# Patient Record
Sex: Female | Born: 1960 | Race: White | Hispanic: No | Marital: Married | Smoking: Never smoker
Health system: Southern US, Community
[De-identification: ages and names within clinical notes are randomized; demographics above are authoritative.]

## PROBLEM LIST (undated history)

## (undated) DIAGNOSIS — G4700 Insomnia, unspecified: Secondary | ICD-10-CM

## (undated) DIAGNOSIS — M810 Age-related osteoporosis without current pathological fracture: Secondary | ICD-10-CM

## (undated) DIAGNOSIS — E039 Hypothyroidism, unspecified: Secondary | ICD-10-CM

## (undated) DIAGNOSIS — G43909 Migraine, unspecified, not intractable, without status migrainosus: Secondary | ICD-10-CM

## (undated) HISTORY — PX: ULNAR NERVE REPAIR: SHX2594

## (undated) HISTORY — PX: FACIAL FRACTURE SURGERY: SHX1570

## (undated) HISTORY — DX: Hypothyroidism, unspecified: E03.9

## (undated) HISTORY — DX: Migraine, unspecified, not intractable, without status migrainosus: G43.909

## (undated) HISTORY — PX: SHOULDER ARTHROSCOPY W/ ROTATOR CUFF REPAIR: SHX2400

## (undated) HISTORY — PX: TONSILLECTOMY: SUR1361

## (undated) HISTORY — DX: Age-related osteoporosis without current pathological fracture: M81.0

## (undated) HISTORY — PX: ABDOMINAL HYSTERECTOMY: SHX81

## (undated) HISTORY — DX: Insomnia, unspecified: G47.00

---

## 1988-05-14 HISTORY — PX: BREAST REDUCTION SURGERY: SHX8

## 1998-05-02 ENCOUNTER — Ambulatory Visit (HOSPITAL_BASED_OUTPATIENT_CLINIC_OR_DEPARTMENT_OTHER): Admission: RE | Admit: 1998-05-02 | Discharge: 1998-05-02 | Payer: Self-pay | Admitting: Specialist

## 2001-01-15 ENCOUNTER — Other Ambulatory Visit: Admission: RE | Admit: 2001-01-15 | Discharge: 2001-01-15 | Payer: Self-pay | Admitting: Obstetrics and Gynecology

## 2001-01-28 ENCOUNTER — Ambulatory Visit (HOSPITAL_COMMUNITY): Admission: RE | Admit: 2001-01-28 | Discharge: 2001-01-28 | Payer: Self-pay | Admitting: Gastroenterology

## 2001-01-28 ENCOUNTER — Encounter: Payer: Self-pay | Admitting: Gastroenterology

## 2001-02-20 ENCOUNTER — Encounter (INDEPENDENT_AMBULATORY_CARE_PROVIDER_SITE_OTHER): Payer: Self-pay | Admitting: Specialist

## 2001-02-20 ENCOUNTER — Other Ambulatory Visit: Admission: RE | Admit: 2001-02-20 | Discharge: 2001-02-20 | Payer: Self-pay | Admitting: Gastroenterology

## 2001-05-22 ENCOUNTER — Emergency Department (HOSPITAL_COMMUNITY): Admission: EM | Admit: 2001-05-22 | Discharge: 2001-05-22 | Payer: Self-pay | Admitting: Emergency Medicine

## 2004-02-08 ENCOUNTER — Emergency Department (HOSPITAL_COMMUNITY): Admission: EM | Admit: 2004-02-08 | Discharge: 2004-02-08 | Payer: Self-pay | Admitting: Emergency Medicine

## 2004-02-14 ENCOUNTER — Emergency Department (HOSPITAL_COMMUNITY): Admission: EM | Admit: 2004-02-14 | Discharge: 2004-02-15 | Payer: Self-pay | Admitting: Emergency Medicine

## 2007-11-22 ENCOUNTER — Emergency Department: Payer: Self-pay | Admitting: Emergency Medicine

## 2007-12-03 ENCOUNTER — Ambulatory Visit: Payer: Self-pay | Admitting: Orthopaedic Surgery

## 2008-01-12 ENCOUNTER — Ambulatory Visit: Payer: Self-pay | Admitting: Gastroenterology

## 2008-01-12 DIAGNOSIS — R630 Anorexia: Secondary | ICD-10-CM

## 2008-01-12 DIAGNOSIS — R634 Abnormal weight loss: Secondary | ICD-10-CM

## 2008-01-12 DIAGNOSIS — E039 Hypothyroidism, unspecified: Secondary | ICD-10-CM | POA: Insufficient documentation

## 2008-01-12 DIAGNOSIS — R197 Diarrhea, unspecified: Secondary | ICD-10-CM

## 2008-01-12 LAB — CONVERTED CEMR LAB
ALT: 15 units/L (ref 0–35)
IgA: 328 mg/dL (ref 68–378)

## 2008-01-13 ENCOUNTER — Encounter: Payer: Self-pay | Admitting: Gastroenterology

## 2008-01-15 ENCOUNTER — Encounter (INDEPENDENT_AMBULATORY_CARE_PROVIDER_SITE_OTHER): Payer: Self-pay

## 2008-02-07 ENCOUNTER — Ambulatory Visit: Payer: Self-pay | Admitting: Orthopaedic Surgery

## 2008-12-22 ENCOUNTER — Other Ambulatory Visit: Admission: RE | Admit: 2008-12-22 | Discharge: 2008-12-22 | Payer: Self-pay | Admitting: Obstetrics and Gynecology

## 2008-12-30 ENCOUNTER — Ambulatory Visit (HOSPITAL_COMMUNITY): Admission: RE | Admit: 2008-12-30 | Discharge: 2008-12-30 | Payer: Self-pay | Admitting: Obstetrics and Gynecology

## 2009-01-24 ENCOUNTER — Emergency Department (HOSPITAL_COMMUNITY): Admission: EM | Admit: 2009-01-24 | Discharge: 2009-01-24 | Payer: Self-pay | Admitting: Emergency Medicine

## 2010-03-23 ENCOUNTER — Encounter: Admission: RE | Admit: 2010-03-23 | Discharge: 2010-03-23 | Payer: Self-pay | Admitting: Internal Medicine

## 2010-08-09 ENCOUNTER — Ambulatory Visit
Admission: RE | Admit: 2010-08-09 | Discharge: 2010-08-09 | Disposition: A | Discharge status: Home / Self Care | Payer: BC Managed Care – PPO | Source: Ambulatory Visit | Attending: Internal Medicine | Admitting: Internal Medicine

## 2010-08-09 ENCOUNTER — Other Ambulatory Visit: Payer: Self-pay | Admitting: Internal Medicine

## 2010-08-09 DIAGNOSIS — R05 Cough: Secondary | ICD-10-CM

## 2011-01-26 ENCOUNTER — Other Ambulatory Visit: Payer: Self-pay | Admitting: Internal Medicine

## 2011-01-26 DIAGNOSIS — R29890 Loss of height: Secondary | ICD-10-CM

## 2011-01-29 ENCOUNTER — Ambulatory Visit
Admission: RE | Admit: 2011-01-29 | Discharge: 2011-01-29 | Disposition: A | Discharge status: Home / Self Care | Payer: BC Managed Care – PPO | Source: Ambulatory Visit | Attending: Internal Medicine | Admitting: Internal Medicine

## 2011-01-29 ENCOUNTER — Other Ambulatory Visit: Payer: Self-pay | Admitting: Internal Medicine

## 2011-01-29 DIAGNOSIS — Z1239 Encounter for other screening for malignant neoplasm of breast: Secondary | ICD-10-CM

## 2011-01-29 DIAGNOSIS — R29890 Loss of height: Secondary | ICD-10-CM

## 2011-09-14 ENCOUNTER — Ambulatory Visit
Admission: RE | Admit: 2011-09-14 | Discharge: 2011-09-14 | Disposition: A | Discharge status: Home / Self Care | Payer: 59 | Source: Ambulatory Visit | Attending: Internal Medicine | Admitting: Internal Medicine

## 2011-09-14 ENCOUNTER — Other Ambulatory Visit: Payer: Self-pay | Admitting: Internal Medicine

## 2011-09-14 DIAGNOSIS — S99921A Unspecified injury of right foot, initial encounter: Secondary | ICD-10-CM

## 2011-09-14 DIAGNOSIS — R52 Pain, unspecified: Secondary | ICD-10-CM

## 2012-01-17 ENCOUNTER — Other Ambulatory Visit: Payer: Self-pay | Admitting: Internal Medicine

## 2012-01-19 ENCOUNTER — Ambulatory Visit
Admission: RE | Admit: 2012-01-19 | Discharge: 2012-01-19 | Disposition: A | Discharge status: Home / Self Care | Payer: 59 | Source: Ambulatory Visit | Attending: Internal Medicine | Admitting: Internal Medicine

## 2012-12-20 ENCOUNTER — Encounter: Payer: Self-pay | Admitting: Gastroenterology

## 2013-07-31 ENCOUNTER — Encounter: Payer: Self-pay | Admitting: Gastroenterology

## 2015-11-11 ENCOUNTER — Ambulatory Visit (INDEPENDENT_AMBULATORY_CARE_PROVIDER_SITE_OTHER): Payer: 59 | Admitting: Pediatrics

## 2015-11-11 ENCOUNTER — Encounter: Payer: Self-pay | Admitting: Pediatrics

## 2015-11-11 VITALS — BP 100/70 | HR 92 | Temp 98.1°F | Resp 16 | Ht 62.4 in | Wt 126.3 lb

## 2015-11-11 DIAGNOSIS — K219 Gastro-esophageal reflux disease without esophagitis: Secondary | ICD-10-CM | POA: Insufficient documentation

## 2015-11-11 DIAGNOSIS — L500 Allergic urticaria: Secondary | ICD-10-CM

## 2015-11-11 DIAGNOSIS — J301 Allergic rhinitis due to pollen: Secondary | ICD-10-CM | POA: Diagnosis not present

## 2015-11-11 MED ORDER — RANITIDINE HCL 150 MG PO TABS: 150.0000 mg | ORAL_TABLET | Freq: Two times a day (BID) | ORAL | Status: AC

## 2015-11-11 NOTE — Patient Instructions (Addendum)
Claritin 10 mg once a day if needed for runny nose or itching See if  bananas irritate your stomach I gave you instructions for gastroesophageal reflux and lactose intolerance Ranitidine 150 mg twice a day for stomach acid if needed Add milk into the diet with lactaid to see if your  symptoms get worse Put 2 small amounts of Carmex and the other lip balm in separate arms and see if you get some redness or itching from them over the next 3 days If you have swelling of your  lips again, write what you had to eat or drink in the previous 4 hours. You may take Benadryl 25 mg every 4 hours if needed  Cool compresses to your lips for 5 minutes. Pat dry and then use Eucerin cream

## 2015-11-11 NOTE — Progress Notes (Signed)
[address] [place] [address] Dept: [phone]  New Patient Note  Patient ID: Kristen DonningJeanne D Mellinger, female    DOB: 05/05/1961  Age: 55 y.o. MRN: [id number] Date of Office Visit: 11/11/2015 Referring provider: No referring provider defined for this encounter.    Chief Complaint: Angioedema  HPI Kristen Dougherty presents for evaluation of 2 episodes of lip swelling associated  with some burning and itching of her lips. Her  symptoms resolved over a week. She has a history of seasonal allergic rhinitis for several years. She is concerned that soy milk or Lactaid milk caused some of the swelling. She has a lactose intolerance. She has a history of osteoporosis since age 55. She has never had hives, eczema or asthmatic symptoms. She is concerned that she may have an allergy to Carmex and and other lip balm. Her lips have been very dry since the reaction occurred.  Review of Systems  Constitutional: Negative.   HENT:       Sinus problems and allergic symptoms particularly in the springtime for several years  Eyes:       One small cataract  Respiratory: Negative.   Cardiovascular: Negative.   Gastrointestinal:       Heartburn at times  Genitourinary: Negative.   Musculoskeletal:       Osteoporosis since age 55. Surgery for rotator cuff and ulnar nerve  compression  Skin:       Lip swelling on 2 different locations lasting about a week  Endo/Heme/Allergies:       No diabetes. Low thyroid function. Vitamin B12 deficiency at times    Outpatient Encounter Prescriptions as of 11/11/2015  Medication Sig  . clonazePAM (KLONOPIN) 1 MG tablet   . cyanocobalamin (,VITAMIN B-12,) 1000 MCG/ML injection INJECT 1 MILLILITER EVERY MONTH  . ibuprofen (ADVIL,MOTRIN) 800 MG tablet   . levothyroxine (SYNTHROID, LEVOTHROID) 100 MCG tablet Take by mouth.  . sodium fluoride (SF) 1.1 % GEL dental gel   . zolpidem (AMBIEN CR) 12.5 MG CR tablet Take by mouth.  . [DISCONTINUED] clonazePAM (KLONOPIN) 1  MG tablet   . ranitidine (ZANTAC) 150 MG tablet Take 1 tablet (150 mg total) by mouth 2 (two) times daily.   No facility-administered encounter medications on file as of 11/11/2015.     Drug Allergies:  Allergies  Allergen Reactions  . Morphine Nausea And Vomiting  . Azithromycin     REACTION: stomach upset    Family History: Lamees's family history is negative for Allergic rhinitis, Angioedema, Asthma, Eczema, Immunodeficiency, and Urticaria..  Social and environmental. She is a Surveyor, miningphysician's assistant. There are 3 dogs in the home. She is not exposed to cigarette smoking. She has never smoked cigarettes    Physical Exam: BP 100/70 mmHg  Pulse 92  Temp(Src) 98.1 F (36.7 C) (Oral)  Resp 16  Ht 5' 2.4" (1.585 m)  Wt 126 lb 5.2 oz (57.3 kg)  BMI 22.81 kg/m2   Physical Exam  Constitutional: She is oriented to person, place, and time. She appears well-developed and well-nourished.  HENT:  Eyes normal. Ears normal. Nose normal. Pharynx normal.  Neck: Neck supple. No thyromegaly present.  Cardiovascular:  S1 and S2 normal no murmurs  Pulmonary/Chest:  Clear to percussion and auscultation  Abdominal: Soft. There is tenderness (nonspecific tenderness in the mid epigastrium. No hepatosplenomegaly).  Lymphadenopathy:    She has no cervical adenopathy.  Neurological: She is alert and oriented to person, place, and time.  Skin:  Clear  Psychiatric: She  has a normal mood and affect. Her behavior is normal. Judgment and thought content normal.  Vitals reviewed.   Diagnostics: Allergy skin test showed a slight reactivity to banana and ragweed. Skin testing to other foods including soy and milk was negative   Assessment Assessment and Plan: 1. Allergic urticaria   2. Allergic rhinitis due to pollen     Meds ordered this encounter  Medications  . ranitidine (ZANTAC) 150 MG tablet    Sig: Take 1 tablet (150 mg total) by mouth 2 (two) times daily.    Dispense:  60 tablet     Refill:  5    For stomach acid if needed.    Patient Instructions  Claritin 10 mg once a day if needed for runny nose or itching See if  bananas irritate your stomach I gave you instructions for gastroesophageal reflux and lactose intolerance Ranitidine 150 mg twice a day for stomach acid if needed Add milk into the diet with lactaid to see if your  symptoms get worse Put 2 small amounts of Carmex and the other lip balm in separate arms and see if you get some redness or itching from them over the next 3 days If you have swelling of your  lips again, write what you had to eat or drink in the previous 4 hours. You may take Benadryl 25 mg every 4 hours if needed  Cool compresses to your lips for 5 minutes. Pat dry and then use Eucerin cream    Return if symptoms worsen or fail to improve.   Thank you for the opportunity to care for this patient.  Please do not hesitate to contact me with questions.  Tonette BihariJ. A. Addis Bennie, M.D.  [facility] [address] [place], [place] [address] [phone]

## 2015-11-22 ENCOUNTER — Ambulatory Visit: Payer: Self-pay | Admitting: Allergy and Immunology

## 2016-05-03 ENCOUNTER — Other Ambulatory Visit: Payer: Self-pay | Admitting: Internal Medicine

## 2016-05-03 DIAGNOSIS — R1010 Upper abdominal pain, unspecified: Secondary | ICD-10-CM

## 2016-05-10 ENCOUNTER — Other Ambulatory Visit: Payer: Self-pay

## 2016-05-11 ENCOUNTER — Other Ambulatory Visit: Payer: Self-pay

## 2016-05-25 ENCOUNTER — Other Ambulatory Visit: Payer: Self-pay

## 2016-05-28 ENCOUNTER — Ambulatory Visit
Admission: RE | Admit: 2016-05-28 | Discharge: 2016-05-28 | Disposition: A | Discharge status: Home / Self Care | Payer: 59 | Source: Ambulatory Visit | Attending: Internal Medicine | Admitting: Internal Medicine

## 2016-05-28 DIAGNOSIS — R1010 Upper abdominal pain, unspecified: Secondary | ICD-10-CM

## 2016-08-08 ENCOUNTER — Encounter (HOSPITAL_BASED_OUTPATIENT_CLINIC_OR_DEPARTMENT_OTHER): Payer: Self-pay | Admitting: *Deleted

## 2016-08-08 ENCOUNTER — Emergency Department (HOSPITAL_BASED_OUTPATIENT_CLINIC_OR_DEPARTMENT_OTHER): Payer: 59

## 2016-08-08 ENCOUNTER — Inpatient Hospital Stay (HOSPITAL_BASED_OUTPATIENT_CLINIC_OR_DEPARTMENT_OTHER)
Admission: EM | Admit: 2016-08-08 | Discharge: 2016-08-13 | DRG: 552 | Disposition: A | Discharge status: Home / Self Care | Payer: 59 | Attending: Family Medicine | Admitting: Family Medicine

## 2016-08-08 DIAGNOSIS — M810 Age-related osteoporosis without current pathological fracture: Secondary | ICD-10-CM | POA: Diagnosis present

## 2016-08-08 DIAGNOSIS — L270 Generalized skin eruption due to drugs and medicaments taken internally: Secondary | ICD-10-CM | POA: Diagnosis not present

## 2016-08-08 DIAGNOSIS — R21 Rash and other nonspecific skin eruption: Secondary | ICD-10-CM | POA: Diagnosis not present

## 2016-08-08 DIAGNOSIS — R591 Generalized enlarged lymph nodes: Secondary | ICD-10-CM | POA: Diagnosis present

## 2016-08-08 DIAGNOSIS — R509 Fever, unspecified: Secondary | ICD-10-CM | POA: Diagnosis not present

## 2016-08-08 DIAGNOSIS — R05 Cough: Secondary | ICD-10-CM | POA: Diagnosis not present

## 2016-08-08 DIAGNOSIS — G629 Polyneuropathy, unspecified: Secondary | ICD-10-CM | POA: Diagnosis present

## 2016-08-08 DIAGNOSIS — L0291 Cutaneous abscess, unspecified: Secondary | ICD-10-CM | POA: Diagnosis present

## 2016-08-08 DIAGNOSIS — R112 Nausea with vomiting, unspecified: Secondary | ICD-10-CM | POA: Diagnosis present

## 2016-08-08 DIAGNOSIS — M25512 Pain in left shoulder: Secondary | ICD-10-CM | POA: Diagnosis not present

## 2016-08-08 DIAGNOSIS — T368X5A Adverse effect of other systemic antibiotics, initial encounter: Secondary | ICD-10-CM | POA: Diagnosis not present

## 2016-08-08 DIAGNOSIS — Z881 Allergy status to other antibiotic agents status: Secondary | ICD-10-CM | POA: Diagnosis not present

## 2016-08-08 DIAGNOSIS — Y92238 Other place in hospital as the place of occurrence of the external cause: Secondary | ICD-10-CM | POA: Diagnosis not present

## 2016-08-08 DIAGNOSIS — R059 Cough, unspecified: Secondary | ICD-10-CM

## 2016-08-08 DIAGNOSIS — Z8261 Family history of arthritis: Secondary | ICD-10-CM | POA: Diagnosis not present

## 2016-08-08 DIAGNOSIS — G039 Meningitis, unspecified: Secondary | ICD-10-CM

## 2016-08-08 DIAGNOSIS — Z79899 Other long term (current) drug therapy: Secondary | ICD-10-CM | POA: Diagnosis not present

## 2016-08-08 DIAGNOSIS — E039 Hypothyroidism, unspecified: Secondary | ICD-10-CM | POA: Diagnosis present

## 2016-08-08 DIAGNOSIS — M542 Cervicalgia: Secondary | ICD-10-CM | POA: Diagnosis present

## 2016-08-08 DIAGNOSIS — K219 Gastro-esophageal reflux disease without esophagitis: Secondary | ICD-10-CM | POA: Diagnosis present

## 2016-08-08 DIAGNOSIS — R519 Headache, unspecified: Secondary | ICD-10-CM

## 2016-08-08 DIAGNOSIS — M436 Torticollis: Secondary | ICD-10-CM

## 2016-08-08 DIAGNOSIS — K449 Diaphragmatic hernia without obstruction or gangrene: Secondary | ICD-10-CM | POA: Diagnosis not present

## 2016-08-08 DIAGNOSIS — R51 Headache: Secondary | ICD-10-CM | POA: Diagnosis present

## 2016-08-08 DIAGNOSIS — K222 Esophageal obstruction: Secondary | ICD-10-CM | POA: Diagnosis not present

## 2016-08-08 DIAGNOSIS — Z885 Allergy status to narcotic agent status: Secondary | ICD-10-CM

## 2016-08-08 LAB — URINALYSIS, ROUTINE W REFLEX MICROSCOPIC
Bilirubin Urine: NEGATIVE
GLUCOSE, UA: NEGATIVE mg/dL
HGB URINE DIPSTICK: NEGATIVE
Ketones, ur: NEGATIVE mg/dL
LEUKOCYTES UA: NEGATIVE
Nitrite: NEGATIVE
PH: 6 (ref 5.0–8.0)
Protein, ur: NEGATIVE mg/dL
SPECIFIC GRAVITY, URINE: 1.009 (ref 1.005–1.030)

## 2016-08-08 LAB — CBC WITH DIFFERENTIAL/PLATELET
BASOS ABS: 0 10*3/uL (ref 0.0–0.1)
BASOS ABS: 0 10*3/uL (ref 0.0–0.1)
BASOS PCT: 1 %
Basophils Relative: 0 %
EOS PCT: 4 %
Eosinophils Absolute: 0.2 10*3/uL (ref 0.0–0.7)
Eosinophils Absolute: 0.2 10*3/uL (ref 0.0–0.7)
Eosinophils Relative: 3 %
HEMATOCRIT: 37.2 % (ref 36.0–46.0)
HEMATOCRIT: 33.9 % — AB (ref 36.0–46.0)
HEMOGLOBIN: 11.7 g/dL — AB (ref 12.0–15.0)
Hemoglobin: 13 g/dL (ref 12.0–15.0)
LYMPHS ABS: 1.1 10*3/uL (ref 0.7–4.0)
LYMPHS PCT: 18 %
Lymphocytes Relative: 17 %
Lymphs Abs: 1.2 10*3/uL (ref 0.7–4.0)
MCH: 30.3 pg (ref 26.0–34.0)
MCH: 30.2 pg (ref 26.0–34.0)
MCHC: 34.9 g/dL (ref 30.0–36.0)
MCHC: 34.5 g/dL (ref 30.0–36.0)
MCV: 86.7 fL (ref 78.0–100.0)
MCV: 87.4 fL (ref 78.0–100.0)
MONO ABS: 0.5 10*3/uL (ref 0.1–1.0)
MONOS PCT: 8 %
Monocytes Absolute: 0.6 10*3/uL (ref 0.1–1.0)
Monocytes Relative: 9 %
NEUTROS ABS: 4.3 10*3/uL (ref 1.7–7.7)
NEUTROS PCT: 71 %
NEUTROS PCT: 69 %
Neutro Abs: 4.4 10*3/uL (ref 1.7–7.7)
PLATELETS: 186 10*3/uL (ref 150–400)
Platelets: 164 10*3/uL (ref 150–400)
RBC: 4.29 MIL/uL (ref 3.87–5.11)
RBC: 3.88 MIL/uL (ref 3.87–5.11)
RDW: 12.3 % (ref 11.5–15.5)
RDW: 12.6 % (ref 11.5–15.5)
WBC: 6.2 10*3/uL (ref 4.0–10.5)
WBC: 6.3 10*3/uL (ref 4.0–10.5)

## 2016-08-08 LAB — COMPREHENSIVE METABOLIC PANEL
ALBUMIN: 4.1 g/dL (ref 3.5–5.0)
ALBUMIN: 3.7 g/dL (ref 3.5–5.0)
ALK PHOS: 36 U/L — AB (ref 38–126)
ALT: 21 U/L (ref 14–54)
ALT: 19 U/L (ref 14–54)
ANION GAP: 7 (ref 5–15)
AST: 20 U/L (ref 15–41)
AST: 16 U/L (ref 15–41)
Alkaline Phosphatase: 45 U/L (ref 38–126)
Anion gap: 5 (ref 5–15)
BUN: 12 mg/dL (ref 6–20)
BUN: 9 mg/dL (ref 6–20)
CALCIUM: 8.4 mg/dL — AB (ref 8.9–10.3)
CHLORIDE: 109 mmol/L (ref 101–111)
CHLORIDE: 107 mmol/L (ref 101–111)
CO2: 26 mmol/L (ref 22–32)
CO2: 25 mmol/L (ref 22–32)
CREATININE: 0.88 mg/dL (ref 0.44–1.00)
Calcium: 9.2 mg/dL (ref 8.9–10.3)
Creatinine, Ser: 0.79 mg/dL (ref 0.44–1.00)
GFR calc Af Amer: >60 mL/min (ref >60)
GFR calc non Af Amer: >60 mL/min (ref >60)
GFR calc non Af Amer: >60 mL/min (ref >60)
GLUCOSE: 114 mg/dL — AB (ref 65–99)
GLUCOSE: 75 mg/dL (ref 65–99)
POTASSIUM: 3.8 mmol/L (ref 3.5–5.1)
POTASSIUM: 3.4 mmol/L — AB (ref 3.5–5.1)
SODIUM: 139 mmol/L (ref 135–145)
Sodium: 140 mmol/L (ref 135–145)
Total Bilirubin: 0.8 mg/dL (ref 0.3–1.2)
Total Bilirubin: 0.6 mg/dL (ref 0.3–1.2)
Total Protein: 7.6 g/dL (ref 6.5–8.1)
Total Protein: 6.7 g/dL (ref 6.5–8.1)

## 2016-08-08 LAB — PROTIME-INR
INR: 0.98
Prothrombin Time: 13 seconds (ref 11.4–15.2)

## 2016-08-08 LAB — I-STAT CG4 LACTIC ACID, ED: LACTIC ACID, VENOUS: 1.21 mmol/L (ref 0.5–1.9)

## 2016-08-08 LAB — C-REACTIVE PROTEIN

## 2016-08-08 LAB — APTT: APTT: 33 s (ref 24–36)

## 2016-08-08 LAB — SEDIMENTATION RATE: SED RATE: 14 mm/h (ref 0–22)

## 2016-08-08 LAB — MONONUCLEOSIS SCREEN: Mono Screen: NEGATIVE

## 2016-08-08 MED ORDER — DIPHENHYDRAMINE HCL 50 MG/ML IJ SOLN
25.0000 mg | Freq: Four times a day (QID) | INTRAMUSCULAR | Status: DC | PRN
  Administered 2016-08-08 – 2016-08-12 (×9): 25 mg via INTRAVENOUS
  Filled 2016-08-08 (×9): qty 1

## 2016-08-08 MED ORDER — LEVOTHYROXINE SODIUM 25 MCG PO TABS
125.0000 ug | ORAL_TABLET | Freq: Every day | ORAL | Status: DC
  Administered 2016-08-09 – 2016-08-13 (×5): 125 ug via ORAL
  Filled 2016-08-08 (×5): qty 1

## 2016-08-08 MED ORDER — DEXTROSE 5 % IV SOLN
550.0000 mg | Freq: Three times a day (TID) | INTRAVENOUS | Status: DC
  Administered 2016-08-08 – 2016-08-12 (×12): 550 mg via INTRAVENOUS
  Filled 2016-08-08 (×13): qty 11

## 2016-08-08 MED ORDER — FAMOTIDINE 20 MG PO TABS: 20.0000 mg | ORAL_TABLET | Freq: Every day | ORAL | Status: DC

## 2016-08-08 MED ORDER — CLONAZEPAM 1 MG PO TABS
1.0000 mg | ORAL_TABLET | Freq: Three times a day (TID) | ORAL | Status: DC | PRN
  Administered 2016-08-09 – 2016-08-12 (×3): 1 mg via ORAL
  Filled 2016-08-08 (×5): qty 1

## 2016-08-08 MED ORDER — DIPHENHYDRAMINE HCL 50 MG/ML IJ SOLN
25.0000 mg | Freq: Once | INTRAMUSCULAR | Status: AC
  Administered 2016-08-08: 25 mg via INTRAVENOUS

## 2016-08-08 MED ORDER — ONDANSETRON HCL 4 MG/2ML IJ SOLN
4.0000 mg | Freq: Once | INTRAMUSCULAR | Status: AC
  Administered 2016-08-08: 4 mg via INTRAVENOUS

## 2016-08-08 MED ORDER — ONDANSETRON HCL 4 MG/2ML IJ SOLN
INTRAMUSCULAR | Status: AC
  Filled 2016-08-08: qty 2

## 2016-08-08 MED ORDER — DEXTROSE 5 % IV SOLN
2.0000 g | Freq: Once | INTRAVENOUS | Status: AC
  Administered 2016-08-08: 2 g via INTRAVENOUS
  Filled 2016-08-08: qty 2

## 2016-08-08 MED ORDER — VANCOMYCIN HCL IN DEXTROSE 750-5 MG/150ML-% IV SOLN
750.0000 mg | Freq: Two times a day (BID) | INTRAVENOUS | Status: DC
  Administered 2016-08-09 – 2016-08-10 (×4): 750 mg via INTRAVENOUS
  Filled 2016-08-08 (×4): qty 150

## 2016-08-08 MED ORDER — SODIUM CHLORIDE 0.9 % IV SOLN
2.0000 g | INTRAVENOUS | Status: DC
  Administered 2016-08-08 – 2016-08-10 (×11): 2 g via INTRAVENOUS
  Filled 2016-08-08 (×14): qty 2000

## 2016-08-08 MED ORDER — SODIUM CHLORIDE 0.9% FLUSH
3.0000 mL | INTRAVENOUS | Status: DC | PRN
  Administered 2016-08-09: 3 mL via INTRAVENOUS
  Filled 2016-08-08: qty 3

## 2016-08-08 MED ORDER — HYDROMORPHONE HCL 1 MG/ML IJ SOLN
1.0000 mg | Freq: Once | INTRAMUSCULAR | Status: AC
  Administered 2016-08-08: 1 mg via INTRAVENOUS
  Filled 2016-08-08: qty 1

## 2016-08-08 MED ORDER — SODIUM CHLORIDE 0.9 % IV SOLN
Freq: Once | INTRAVENOUS | Status: AC
Start: 2016-08-08 — End: 2016-08-08
  Administered 2016-08-08: 1000 mL via INTRAVENOUS

## 2016-08-08 MED ORDER — LEVOTHYROXINE SODIUM 100 MCG PO TABS: 100.0000 ug | ORAL_TABLET | Freq: Every day | ORAL | Status: DC

## 2016-08-08 MED ORDER — HYDROMORPHONE HCL 1 MG/ML IJ SOLN
0.5000 mg | Freq: Once | INTRAMUSCULAR | Status: AC
Start: 2016-08-08 — End: 2016-08-08
  Administered 2016-08-08: 0.5 mg via INTRAVENOUS

## 2016-08-08 MED ORDER — VANCOMYCIN HCL IN DEXTROSE 1-5 GM/200ML-% IV SOLN
1000.0000 mg | Freq: Once | INTRAVENOUS | Status: AC
  Administered 2016-08-08: 1000 mg via INTRAVENOUS
  Filled 2016-08-08: qty 200

## 2016-08-08 MED ORDER — METHOCARBAMOL 500 MG PO TABS
500.0000 mg | ORAL_TABLET | Freq: Three times a day (TID) | ORAL | Status: DC
  Administered 2016-08-08 – 2016-08-10 (×6): 500 mg via ORAL
  Filled 2016-08-08 (×6): qty 1

## 2016-08-08 MED ORDER — HYDROMORPHONE HCL 1 MG/ML IJ SOLN
1.0000 mg | INTRAMUSCULAR | Status: DC | PRN
  Administered 2016-08-08 – 2016-08-12 (×13): 1 mg via INTRAVENOUS
  Filled 2016-08-08 (×13): qty 1

## 2016-08-08 MED ORDER — DIPHENHYDRAMINE HCL 50 MG/ML IJ SOLN
INTRAMUSCULAR | Status: AC
  Filled 2016-08-08: qty 1

## 2016-08-08 MED ORDER — FENTANYL CITRATE (PF) 100 MCG/2ML IJ SOLN
50.0000 ug | Freq: Once | INTRAMUSCULAR | Status: AC
  Administered 2016-08-08: 50 ug via INTRAVENOUS
  Filled 2016-08-08: qty 2

## 2016-08-08 MED ORDER — SODIUM CHLORIDE 0.9% FLUSH
3.0000 mL | Freq: Two times a day (BID) | INTRAVENOUS | Status: DC
  Administered 2016-08-08 – 2016-08-11 (×3): 3 mL via INTRAVENOUS

## 2016-08-08 MED ORDER — SODIUM CHLORIDE 0.9 % IV SOLN: 250.0000 mL | INTRAVENOUS | Status: DC | PRN

## 2016-08-08 MED ORDER — ENOXAPARIN SODIUM 40 MG/0.4ML ~~LOC~~ SOLN
40.0000 mg | SUBCUTANEOUS | Status: DC
  Administered 2016-08-08: 40 mg via SUBCUTANEOUS
  Filled 2016-08-08: qty 0.4

## 2016-08-08 MED ORDER — DEXTROSE 5 % IV SOLN
2.0000 g | Freq: Two times a day (BID) | INTRAVENOUS | Status: DC
  Administered 2016-08-08 – 2016-08-10 (×4): 2 g via INTRAVENOUS
  Filled 2016-08-08 (×5): qty 2

## 2016-08-08 MED ORDER — PANTOPRAZOLE SODIUM 40 MG PO TBEC
40.0000 mg | DELAYED_RELEASE_TABLET | Freq: Every day | ORAL | Status: DC
  Administered 2016-08-09 – 2016-08-13 (×5): 40 mg via ORAL
  Filled 2016-08-08 (×5): qty 1

## 2016-08-08 MED ORDER — SODIUM CHLORIDE 0.9 % IV SOLN
Freq: Once | INTRAVENOUS | Status: AC
  Administered 2016-08-08: 18:00:00 via INTRAVENOUS

## 2016-08-08 MED ORDER — ZOLPIDEM TARTRATE 5 MG PO TABS
5.0000 mg | ORAL_TABLET | Freq: Every evening | ORAL | Status: DC | PRN
  Administered 2016-08-08 – 2016-08-12 (×4): 5 mg via ORAL
  Filled 2016-08-08 (×5): qty 1

## 2016-08-08 MED ORDER — ACETAMINOPHEN 325 MG PO TABS
650.0000 mg | ORAL_TABLET | Freq: Four times a day (QID) | ORAL | Status: DC | PRN
  Administered 2016-08-09 – 2016-08-12 (×7): 650 mg via ORAL
  Filled 2016-08-08 (×7): qty 2

## 2016-08-08 MED ORDER — IBUPROFEN 200 MG PO TABS: 600.0000 mg | ORAL_TABLET | Freq: Four times a day (QID) | ORAL | Status: DC | PRN

## 2016-08-08 MED ORDER — SODIUM FLUORIDE 1.1 % DT GEL
Freq: Every day | DENTAL | Status: DC
  Administered 2016-08-12: 22:00:00 via DENTAL

## 2016-08-08 MED ORDER — ONDANSETRON HCL 4 MG/2ML IJ SOLN
4.0000 mg | Freq: Four times a day (QID) | INTRAMUSCULAR | Status: DC | PRN
  Administered 2016-08-09 – 2016-08-12 (×6): 4 mg via INTRAVENOUS
  Filled 2016-08-08 (×7): qty 2

## 2016-08-08 NOTE — ED Notes (Signed)
ED Provider at bedside. 

## 2016-08-08 NOTE — ED Provider Notes (Signed)
Complains of fever intermittently for the past 3 weeks rash developed a few days ago on arms and legs.. Also with slight cough for 3 weeks She developed headache which she describes as a migraine and inability to move her neck in any direction over the past 24 hours. Accompanied by pain in her left shoulder and left wrist. On exam patient is alert appears mildly uncomfortable HEENT exam no mucosal lesion. Neck has pain on rotation or neck lungs clear auscultation abdomen nondistended nontender. Left upper extremity she has pain on active movement of shoulder. Shoulder is not red warm or swollen. Wrist is not red warm or tender. Radial pulse 2+. All other extremities without redness swelling or tenderness neurovascular intact skin petechial rash at legs below the knees bilaterally. Neurologic Glasgow Coma Score 15 cranial nerves II through XII intact alert moves all extremities.   [name], MD 08/08/16 [phone]

## 2016-08-08 NOTE — ED Notes (Addendum)
Red rash has worsened to face , ED PA informed

## 2016-08-08 NOTE — Progress Notes (Addendum)
Pharmacy Antibiotic Note  Kristen Dougherty is a 56 y.o. female admitted on 08/08/2016 with neck pain, rash, fever, HA-->starting abx for possible neck abscess vs. meningitis.  Pharmacy has been consulted for Vancomycin, Rocephin, Acyclovir dosing. She is currently afebrile without leukocytosis or acidosis.  Renal function is at patient's baseline. She received Rocephin 2gm & [facility]. Noted reaction to initial dose of Vancomycin thought to be Red Man's Syndrome.   Plan: Rocephin 2gm IV q12h Acyclovir 550mg  (~10mg /kg AdjBW) IV q8h Ampicillin per MD Vancomycin 750mg  IV q12h infused over TWO hours. Check Vancomycin trough at steady state Monitor renal function, cx data, and patient progress  Height: 5\' 4"  (162.6 cm) Weight: 130 lb (59 kg) IBW/kg (Calculated) : 54.7  Temp (24hrs), Avg:98.4 F (36.9 C), Min:98.2 F (36.8 C), Max:98.7 F (37.1 C)   Recent Labs Lab 08/08/16 1038 08/08/16 1114  WBC 6.2  --   CREATININE 0.88  --   LATICACIDVEN  --  1.21    Estimated Creatinine Clearance: 63.1 mL/min (by C-G formula based on SCr of 0.88 mg/dL).    Allergies  Allergen Reactions  . Morphine Nausea And Vomiting  . Azithromycin     REACTION: stomach upset  . Vancomycin     ? Red Man Syndrome     Antimicrobials this admission: 3/28 Rocephin >>  3/28 Vanc >>  3/28 Ampicillin >> 3/28 Acyclovir>>  Dose adjustments this admission:  Microbiology results: 3/28 BCx: sent 3/28 UA: negative 3/28 CSF cx: ordered 3/28 HIV: 3/28 HSV PCR:   Thank you for allowing pharmacy to be a part of this patient's care.  Elson ClanLilliston, Aerin Delany Michelle 08/08/2016 5:33 PM

## 2016-08-08 NOTE — Progress Notes (Addendum)
BP (!) 132/92 (BP Location: Right Arm)   Pulse 83   Temp 98.7 F (37.1 C) (Oral)   Resp 18   Ht 5\' 2"  (1.575 m)   Wt 59 kg (130 lb)   LMP 07/12/2016   SpO2 99%   BMI 23.1978 kg/m   56 year old female with an EGD about a month ago comes in for 2 weeks of fever, patient check relation check her temperature daily and has been ranging about 101 102, she also been complaining of neck pain with neck lymphadenopathy, yesterday it got worse to the point where she can move her left shoulder, she also developed a petechial rash, she was started on empiric Antibiotics [facility] [place]  Complete metabolic panel was unremarkable, CBC was 1.2 CBC showed white count 6.2 with no left shift, UA appears to be clean.  Has been transferred here for an MRI of her neck to rule out discitis MedSurg bed

## 2016-08-08 NOTE — H&P (Signed)
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  Went to have a colonoscopy and endoscopy Feb 23rd-found a shatzki ring and hiatal hernia --fever for the whole pm and the next day[place] arrived since March 8th-high fever, achy unable to cough-posterior and back pains + dysnea and  Alternating fevers and chills--as high as 102 f notocied a rash in the extremities and lowwer ext in acral pattern aND PAIN IN THE NECK ON THE l SIDE WHICH IS NEW  Migraine since last Sunday-would also throw up since last Sunday developed knots on the back of the neck ~ 1 week ago and felt like they were going to buyrst Has been fatigued--seemed flu-like oper family Has been gettign some numbness in hands and feet   Stiff neck and hardly can turn it and excurciating to turn and had some radiation to her L wrist as well.    Labd showed reg bmet Normal hemoglobin and white count, normal sed rate   Social h/o NO SMOKE NOR DRINK NO DRUGS MARRIED 23 YR Has 3 kids  Allergic Vomits with morphine  Family h/o  Mom has rheum arthritis  Review of Systems: The patient denies melena, dysuria,rigors Nausea occ sore throat No cp Too weak to cough No sputum  Past Medical History:  Diagnosis Date  . Hypothyroidism   . Insomnia   . Migraine   . Osteoporosis    Past Surgical History:  Procedure Laterality Date  . BREAST REDUCTION SURGERY  1990  . FACIAL FRACTURE SURGERY Left   . SHOULDER  ARTHROSCOPY W/ ROTATOR CUFF REPAIR Right   . ULNAR NERVE REPAIR Right    Social History:  Social History   Social History Narrative  . No narrative on file    Allergies  Allergen Reactions  . Morphine Nausea And Vomiting  . Azithromycin     REACTION: stomach upset  . Vancomycin     ? Red Man Syndrome     Family History  Problem Relation Age of Onset  . Allergic rhinitis Neg Hx   . Angioedema Neg Hx   . Asthma Neg Hx   . Eczema Neg Hx   . Immunodeficiency Neg Hx   . Urticaria Neg Hx    Prior to Admission medications   Medication Sig Start Date End Date Taking? Authorizing Provider  clonazePAM (KLONOPIN) 1 MG tablet  10/16/15   Historical Provider, MD  cyanocobalamin (,VITAMIN B-12,) 1000 MCG/ML injection INJECT 1 MILLILITER EVERY MONTH 10/23/15   Historical Provider, MD  ibuprofen (ADVIL,MOTRIN) 800 MG tablet  03/14/15   Historical Provider, MD  levothyroxine (SYNTHROID, LEVOTHROID) 100 MCG tablet Take by mouth.    Historical Provider, MD  ranitidine (ZANTAC) 150 MG tablet Take 1 tablet (150 mg total) by mouth 2 (two) times daily. 11/11/15   Fletcher AnonJose A Bardelas, MD  sodium fluoride (SF) 1.1 % GEL dental gel  05/17/15   Historical Provider, MD  zolpidem (AMBIEN CR) 12.5 MG CR tablet Take by mouth.    Historical Provider,  MD   Physical Exam: Vitals:   08/08/16 1330 08/08/16 1400 08/08/16 1415 08/08/16 1517  BP:  (!) 128/97  133/82  Pulse:  74 79 87  Resp:  12 18 14   Temp: 98.3 F (36.8 C)   98.2 F (36.8 C)  TempSrc: Oral   Oral  SpO2:  99% 97% 96%  Weight:    59 kg (130 lb)  Height:    5\' 4"  (1.626 m)     General:  Alert pleasant but tired apearing, no ict no pallor  Eyes:  eomi ncat  ENT: neck stiff-posterior auricular and scalene lymphadenopathy  Neck: stiff all over trapzius  Cardiovascular:  s 1s 2 no m/r/g  Respiratory: clear no added sound  Abdomen:  sof tn tnd no rebound no gaurd  Skin:  Macular petechial rash  Musculoskeletal:  soft  Psychiatric:   intact  Neurologic:  weaker in LUE than R, but power in fingers intact.  BRachioradialis and bicep intact bilaterally  No le edema  Labs on Admission:  Basic Metabolic Panel:  Recent Labs Lab 08/08/16 1038  NA 140  K 3.8  CL 109  CO2 26  GLUCOSE 114*  BUN 12  CREATININE 0.88  CALCIUM 9.2   Liver Function Tests:  Recent Labs Lab 08/08/16 1038  AST 20  ALT 21  ALKPHOS 45  BILITOT 0.8  PROT 7.6  ALBUMIN 4.1   No results for input(s): LIPASE, AMYLASE in the last 168 hours. No results for input(s): AMMONIA in the last 168 hours. CBC:  Recent Labs Lab 08/08/16 1038  WBC 6.2  NEUTROABS 4.4  HGB 13.0  HCT 37.2  MCV 86.7  PLT 186   Cardiac Enzymes: No results for input(s): CKTOTAL, CKMB, CKMBINDEX, TROPONINI in the last 168 hours.  BNP (last 3 results) No results for input(s): BNP in the last 8760 hours.  ProBNP (last 3 results) No results for input(s): PROBNP in the last 8760 hours.  CBG: No results for input(s): GLUCAP in the last 168 hours.  Radiological Exams on Admission: Dg Chest 2 View  Result Date: 08/08/2016 CLINICAL DATA:  Fever and shortness of breath EXAM: CHEST  2 VIEW COMPARISON:  August 09, 2010. FINDINGS: There is no edema or consolidation. The heart size and pulmonary vascularity are normal. No adenopathy. No bone lesions. IMPRESSION: No edema or consolidation. Electronically Signed   By: Bretta Bang III M.D.   On: 08/08/2016 10:27    EKG: Independently reviewed. reviewed  Assessment/Plan Active Problems:   Neck pain   Abscess    Neck Pain-with rash, fever and Headache--DDx meningitis vs neck abscess from recent instrumentation Patient and I had a long discussion--she would prefer to get imaging of neck first for possible abcess.  I have explained that diagnostic yield would probably be lower with starting empiric abx for meningitis.  Will need droplet room. Await MRI neck-will need LP if no source although yield will be  decreased Starting Vanc/Cefepime/Ampicillin and Acyclovir given Dilaudid for neck spasm adding Robaxin 500 tid  Will try to alert Dr. Doretha Imus of [facility] who did EGD to let him know of situation  Hypothryoid-continue 100 mcg   GERD-cont pepcid  Neuropathy-cont Clonazepam 1 tid  Chr Tonsiliitis-reports multiple episodes of infection-could be source.  Will follow   discussed in full detail with husband at bedsdie confirmed DNAR status Lovenox  Time spent: 70  Mahala Menghini [facility] Pager [phone]  If 7PM-7AM, please contact night-coverage [email or web address] Password [email or web address] 08/08/2016, 5:02 PM

## 2016-08-08 NOTE — ED Notes (Signed)
ED MD informed of pain to neck, left arm

## 2016-08-08 NOTE — ED Provider Notes (Signed)
[facility] Provider Note   CSN: [id number] Arrival date & time: 08/08/16  1027     History   Chief Complaint Chief Complaint  Patient presents with  . Neck Pain    HPI Kristen Dougherty is a 56 y.o. female.  HPI Kristen Dougherty is a 56 y.o. female presents to emergency department with multiple complaints. Patient states she is having neck pain, neck stiffness, left shoulder pain, sore throat, lymphadenopathy in her neck, cough, shortness of breath, weakness, rash. States she has had intermittent fever up to 102 almost daily for the last 3 weeks. She reports her symptoms initially began after she had an endoscopy one month ago. She states she then had to travel to [facility] for her job, and states since then she has developed fever, swollen lymph nodes, generalized malaise. Yesterday morning she woke up with her neck being stiff, and today she states she is unable to move it. She also reports she woke up this morning with left shoulder pain, unable to move her shoulder. Reports some tingling sensation in the hand and pain shooting down into the hand. She denies any injuries. She reports intermittent headaches, but states these headaches feel like her migraines. She denies photophobia. She has been taking fluids medications and Tylenol at home, did not take anything this morning. She reports generalized weakness, decreased appetite, dizziness, weakness. She states she has a cough but is too weak to cough. She also has noticed that she developed a rash yesterday mainly on her arms and legs, states rash is erythematous, non-itchy. No similar symptoms before.   Past Medical History:  Diagnosis Date  . Hypothyroidism   . Insomnia   . Migraine   . Osteoporosis     Patient Active Problem List   Diagnosis Date Noted  . Allergic urticaria 11/11/2015  . Allergic rhinitis due to pollen 11/11/2015  . Gastroesophageal reflux disease without esophagitis 11/11/2015  . DIARRHEA OF  PRESUMED INFECTIOUS ORIGIN 01/12/2008  . HYPOTHYROIDISM 01/12/2008  . ANOREXIA 01/12/2008  . WEIGHT LOSS 01/12/2008    Past Surgical History:  Procedure Laterality Date  . BREAST REDUCTION SURGERY  1990  . FACIAL FRACTURE SURGERY Left   . SHOULDER ARTHROSCOPY W/ ROTATOR CUFF REPAIR Right   . ULNAR NERVE REPAIR Right     OB History    No data available       Home Medications    Prior to Admission medications   Medication Sig Start Date End Date Taking? Authorizing Provider  clonazePAM (KLONOPIN) 1 MG tablet  10/16/15   Historical Provider, MD  cyanocobalamin (,VITAMIN B-12,) 1000 MCG/ML injection INJECT 1 MILLILITER EVERY MONTH 10/23/15   Historical Provider, MD  ibuprofen (ADVIL,MOTRIN) 800 MG tablet  03/14/15   Historical Provider, MD  levothyroxine (SYNTHROID, LEVOTHROID) 100 MCG tablet Take by mouth.    Historical Provider, MD  ranitidine (ZANTAC) 150 MG tablet Take 1 tablet (150 mg total) by mouth 2 (two) times daily. 11/11/15   Fletcher Anon, MD  sodium fluoride (SF) 1.1 % GEL dental gel  05/17/15   Historical Provider, MD  zolpidem (AMBIEN CR) 12.5 MG CR tablet Take by mouth.    Historical Provider, MD    Family History Family History  Problem Relation Age of Onset  . Allergic rhinitis Neg Hx   . Angioedema Neg Hx   . Asthma Neg Hx   . Eczema Neg Hx   . Immunodeficiency Neg Hx   . Urticaria Neg Hx  Social History Social History  Substance Use Topics  . Smoking status: Never Smoker  . Smokeless tobacco: Never Used  . Alcohol use Yes     Allergies   Morphine and Azithromycin   Review of Systems Review of Systems  Constitutional: Positive for appetite change, chills, fatigue and fever.  HENT: Positive for sore throat. Negative for congestion, ear pain, trouble swallowing and voice change.   Respiratory: Negative for cough, chest tightness and shortness of breath.   Cardiovascular: Negative for chest pain, palpitations and leg swelling.    Gastrointestinal: Positive for nausea. Negative for abdominal pain, diarrhea and vomiting.  Genitourinary: Negative for dysuria, flank pain, pelvic pain, vaginal bleeding, vaginal discharge and vaginal pain.  Musculoskeletal: Positive for arthralgias, myalgias, neck pain and neck stiffness.  Skin: Negative for rash.  Neurological: Positive for weakness and headaches. Negative for dizziness.  All other systems reviewed and are negative.    Physical Exam Updated Vital Signs BP 132/84 (BP Location: Right Arm)   Pulse 95   Temp 98.4 F (36.9 C) (Oral)   Resp 18   Ht 5\' 2"  (1.575 m)   Wt 59 kg   LMP 07/12/2016   SpO2 98%   BMI 23.78 kg/m   Physical Exam  Constitutional: She is oriented to person, place, and time. She appears well-developed and well-nourished. No distress.  HENT:  Head: Normocephalic.  Nose: Nose normal.  Mouth/Throat: Oropharynx is clear and moist. No oropharyngeal exudate.  Eyes: Conjunctivae and EOM are normal. Pupils are equal, round, and reactive to light.  Neck:  Right postauricular and posterior lymphadenopathy, tender. Diffuse tenderness to palpation. Patient refusing to move her neck. Unable to put chin to chest.  Cardiovascular: Normal rate, regular rhythm and normal heart sounds.   Pulmonary/Chest: Effort normal and breath sounds normal. No respiratory distress. She has no wheezes. She has no rales.  Abdominal: Soft. Bowel sounds are normal. She exhibits no distension. There is no tenderness. There is no rebound.  Musculoskeletal: She exhibits no edema.  No tenderness to palpation of the left shoulder, elbow, wrist. Pain with passive or active range of motion of left shoulder joint. Normal left elbow and wrist. Distal radial pulses intact. Grip strength is 5 out of 5 and equal bilaterally.  Neurological: She is alert and oriented to person, place, and time. She displays normal reflexes.  Skin: Skin is warm and dry. Capillary refill takes less than 2  seconds.  Erythematous blanching rash to bilateral forearms, flat, macular. There is petechial rash to bilateral lower legs. Nonblanching. Flat.  Psychiatric: She has a normal mood and affect. Her behavior is normal.  Nursing note and vitals reviewed.    ED Treatments / Results  Labs (all labs ordered are listed, but only abnormal results are displayed) Labs Reviewed  COMPREHENSIVE METABOLIC PANEL - Abnormal; Notable for the following:       Result Value   Glucose, Bld 114 (*)    All other components within normal limits  CULTURE, BLOOD (ROUTINE X 2)  CULTURE, BLOOD (ROUTINE X 2)  CBC WITH DIFFERENTIAL/PLATELET  MONONUCLEOSIS SCREEN  URINALYSIS, ROUTINE W REFLEX MICROSCOPIC  PROTIME-INR  APTT  SEDIMENTATION RATE  C-REACTIVE PROTEIN  I-STAT CG4 LACTIC ACID, ED    EKG  EKG Interpretation None       Radiology Dg Chest 2 View  Result Date: 08/08/2016 CLINICAL DATA:  Fever and shortness of breath EXAM: CHEST  2 VIEW COMPARISON:  August 09, 2010. FINDINGS: There is no edema or  consolidation. The heart size and pulmonary vascularity are normal. No adenopathy. No bone lesions. IMPRESSION: No edema or consolidation. Electronically Signed   By: Bretta Bang III M.D.   On: 08/08/2016 10:27    Procedures Procedures (including critical care time)  Medications Ordered in ED Medications  0.9 %  sodium chloride infusion (not administered)     Initial Impression / Assessment and Plan / ED Course  I have reviewed the triage vital signs and the nursing notes.  Pertinent labs & imaging results that were available during my care of the patient were reviewed by me and considered in my medical decision making (see chart for details).     Pt in ED with multiple complaints, fever for 3 wks, headaches, now neck stiffness, left shoulder pain, left wrist pain, cough, sob, rash. Differentials concerning for meningitis, viral infection, cervical radiculopathy, cervical infection suck as  diskitis, epidural abscess. Will check labs, chest xray. Spoke with Dr. Ethelda Chick, who has seen pt, will add antibiotics.    Spoke with radiologist, recommended MR with/wo contrast to ro diskitis, epidural abscess. Will admit.   1:28 PM Patient's pain was not improved with fentanyl, she was given a dose of Dilaudid. Her blood work all unremarkable. I spoke earlier with hospitalist who will admit her for further studies and treatment she states her pain did improve after Dilaudid and she is moving her left hand better, continues to have neck pain. Patient also developed some erythema to the skin, which started after receiving vancomycin. I suspect reaction, and she was given 25 milligrams of Benadryl. Patient awaiting transport for admission.  Pt transferred by [facility], stable prior to dc, reactionto vancomycin improved.   Vitals:   08/08/16 1330 08/08/16 1400 08/08/16 1415 08/08/16 1517  BP:  (!) 128/97  133/82  Pulse:  74 79 87  Resp:  12 18 14   Temp: 98.3 F (36.8 C)   98.2 F (36.8 C)  TempSrc: Oral   Oral  SpO2:  99% 97% 96%  Weight:    59 kg  Height:    5\' 4"  (1.626 m)      Final Clinical Impressions(s) / ED Diagnoses   Final diagnoses:  Neck stiffness  Rash  Acute pain of left shoulder  Cough    New Prescriptions New Prescriptions   No medications on file     Jaynie Crumble, PA-C 08/08/16 1639    Doug Sou, MD 08/08/16 [phone]

## 2016-08-08 NOTE — ED Triage Notes (Signed)
Pt reports neck pain and stiffness x march 8th when [place]. Pt reports fevers off and on since then also, and "migraines" off and on since march 8th. Pt reports "lumps" to behind both ears that swell and resolve since then. Mid back pain off and on also.

## 2016-08-08 NOTE — ED Notes (Signed)
Pain to ant neck, headache and pain to left upper extremity with movement. Generalized weakness, malaise . Has a fine red rash to face and extremities

## 2016-08-09 ENCOUNTER — Inpatient Hospital Stay (HOSPITAL_COMMUNITY): Payer: 59

## 2016-08-09 LAB — CBC
HEMATOCRIT: 31.8 % — AB (ref 36.0–46.0)
HEMOGLOBIN: 11 g/dL — AB (ref 12.0–15.0)
MCH: 30.2 pg (ref 26.0–34.0)
MCHC: 34.6 g/dL (ref 30.0–36.0)
MCV: 87.4 fL (ref 78.0–100.0)
Platelets: 181 10*3/uL (ref 150–400)
RBC: 3.64 MIL/uL — AB (ref 3.87–5.11)
RDW: 12.6 % (ref 11.5–15.5)
WBC: 6.8 10*3/uL (ref 4.0–10.5)

## 2016-08-09 LAB — COMPREHENSIVE METABOLIC PANEL
ALT: 17 U/L (ref 14–54)
AST: 17 U/L (ref 15–41)
Albumin: 3.5 g/dL (ref 3.5–5.0)
Alkaline Phosphatase: 37 U/L — ABNORMAL LOW (ref 38–126)
Anion gap: 5 (ref 5–15)
BUN: 7 mg/dL (ref 6–20)
CHLORIDE: 105 mmol/L (ref 101–111)
CO2: 27 mmol/L (ref 22–32)
Calcium: 8.1 mg/dL — ABNORMAL LOW (ref 8.9–10.3)
Creatinine, Ser: 0.89 mg/dL (ref 0.44–1.00)
Glucose, Bld: 101 mg/dL — ABNORMAL HIGH (ref 65–99)
POTASSIUM: 3.5 mmol/L (ref 3.5–5.1)
Sodium: 137 mmol/L (ref 135–145)
Total Bilirubin: 0.8 mg/dL (ref 0.3–1.2)
Total Protein: 6.4 g/dL — ABNORMAL LOW (ref 6.5–8.1)

## 2016-08-09 LAB — CSF CELL COUNT WITH DIFFERENTIAL
RBC COUNT CSF: 82 /mm3 — AB
RBC Count, CSF: 7 /mm3 — ABNORMAL HIGH
TUBE #: 1
Tube #: 4
WBC CSF: 0 /mm3 (ref 0–5)
WBC CSF: 1 /mm3 (ref 0–5)

## 2016-08-09 LAB — GLUCOSE, CSF: Glucose, CSF: 53 mg/dL (ref 40–70)

## 2016-08-09 LAB — PROTIME-INR
INR: 1.06
Prothrombin Time: 13.8 seconds (ref 11.4–15.2)

## 2016-08-09 LAB — PROTEIN, CSF: Total  Protein, CSF: 35 mg/dL (ref 15–45)

## 2016-08-09 LAB — HIV ANTIBODY (ROUTINE TESTING W REFLEX): HIV Screen 4th Generation wRfx: NONREACTIVE

## 2016-08-09 LAB — CRYPTOCOCCAL ANTIGEN, CSF: CRYPTO AG: NEGATIVE

## 2016-08-09 MED ORDER — LIDOCAINE HCL 1 % IJ SOLN
INTRAMUSCULAR | Status: AC
  Filled 2016-08-09: qty 20

## 2016-08-09 MED ORDER — LORAZEPAM 2 MG/ML IJ SOLN
1.0000 mg | Freq: Once | INTRAMUSCULAR | Status: AC
  Administered 2016-08-09: 1 mg via INTRAVENOUS
  Filled 2016-08-09 (×2): qty 1

## 2016-08-09 MED ORDER — SODIUM CHLORIDE 0.9 % IV SOLN
INTRAVENOUS | Status: DC
  Administered 2016-08-09 – 2016-08-10 (×2): via INTRAVENOUS

## 2016-08-09 MED ORDER — GADOBENATE DIMEGLUMINE 529 MG/ML IV SOLN
15.0000 mL | Freq: Once | INTRAVENOUS | Status: AC | PRN
  Administered 2016-08-09: 12 mL via INTRAVENOUS

## 2016-08-09 NOTE — Progress Notes (Signed)
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  need to rule out meningitis-unfortunately, received in ED at [facility] vanc/cefepime-started on broad coverage.  Will de-escalate dependant on clinical course.  Follow blood and urine cultures done on 3/28--broadened work-up to include AI processes.  HIV neg.  Hepatitis panel pending.  Is hyperesthetic now in the R forearm without focal deficit.  Might need input from Neuro.  Keep droplet precautions for now  Neuropathy-continue Clonazepam 1 gm tid--She also has is on Dilaudid 1 mg q 2 prn, and also started on Robaxin 500 tid  GERD-cont Protonix 40 daily    lovenox dnar discusse dwith husband inpatient   Consultants:   None yet  Procedures:   none  Antimicrobials:   vanc 3/28  Cefepime 3/28  Ampicillin 3/28  acyclovir 3/28   Subjective:  Feels poorly, no better and in some ways worse.  R arm is very painful  Objective: Vitals:   08/08/16 1415 08/08/16 1517 08/08/16 2052 08/09/16 0415  BP:  133/82 114/70 120/70  Pulse: 79 87 81 89  Resp: 18 14 16 16   Temp:  98.2 F (36.8 C) 99 F (37.2 C) 99.4 F (37.4 C)  TempSrc:  Oral Oral Oral  SpO2: 97% 96% 98% 97%  Weight:  59 kg (130 lb)    Height:  5\' 4"  (1.626 m)      Intake/Output Summary (Last 24 hours) at  08/09/16 1454 Last data filed at 08/09/16 1330  Gross per 24 hour  Intake              968 ml  Output                0 ml  Net              968 ml   Filed Weights   08/08/16 0940 08/08/16 1517  Weight: 59 kg (130 lb) 59 kg (130 lb)    Examination:  General exam: some uncomfortable  Respiratory system: Clear to auscultation. Respiratory effort normal.  Pupils track equally no facial drrop Cardiovascular system: S1 & S2 heard, RRR. No JVD, murmurs, rubs, gallops or clicks. No pedal edema. Gastrointestinal system: Abdomen is nondistended, soft and nontender.  Central nervous system: Alert and oriented.  Extremities: Symmetric 5 x 5 power. Skin: No rashes, lesions or ulcers. Hyperesthetic Skin with rash to R forearm.  Tender to supination and pronation Psychiatry: Judgement and insight appear normal. Mood & affect appropriate.     Data Reviewed: I have personally reviewed following labs and imaging studies  CBC:  Recent Labs Lab 08/08/16 1038 08/08/16 1722 08/09/16 0621  WBC 6.2 6.3 6.8  NEUTROABS 4.4 4.3  --   HGB 13.0 11.7* 11.0*  HCT 37.2 33.9* 31.8*  MCV 86.7 87.4 87.4  PLT 186 164 181  Basic Metabolic Panel:  Recent Labs Lab 08/08/16 1038 08/08/16 1722 08/09/16 0621  NA 140 139 137  K 3.8 3.4* 3.5  CL 109 107 105  CO2 26 25 27   GLUCOSE 114* 75 101*  BUN 12 9 7   CREATININE 0.88 0.79 0.89  CALCIUM 9.2 8.4* 8.1*   GFR: Estimated Creatinine Clearance: 62.4 mL/min (by C-G formula based on SCr of 0.89 mg/dL). Liver Function Tests:  Recent Labs Lab 08/08/16 1038 08/08/16 1722 08/09/16 0621  AST 20 16 17   ALT 21 19 17   ALKPHOS 45 36* 37*  BILITOT 0.8 0.6 0.8  PROT 7.6 6.7 6.4*  ALBUMIN 4.1 3.7 3.5   No results for input(s): LIPASE, AMYLASE in the last 168 hours. No results for input(s): AMMONIA in the last 168 hours. Coagulation Profile:  Recent Labs Lab 08/08/16 1108 08/09/16 0621  INR 0.98 1.06   Cardiac Enzymes: No results for input(s):  CKTOTAL, CKMB, CKMBINDEX, TROPONINI in the last 168 hours. BNP (last 3 results) No results for input(s): PROBNP in the last 8760 hours. HbA1C: No results for input(s): HGBA1C in the last 72 hours. CBG: No results for input(s): GLUCAP in the last 168 hours. Lipid Profile: No results for input(s): CHOL, HDL, LDLCALC, TRIG, CHOLHDL, LDLDIRECT in the last 72 hours. Thyroid Function Tests: No results for input(s): TSH, T4TOTAL, FREET4, T3FREE, THYROIDAB in the last 72 hours. Anemia Panel: No results for input(s): VITAMINB12, FOLATE, FERRITIN, TIBC, IRON, RETICCTPCT in the last 72 hours. Urine analysis:    Component Value Date/Time   COLORURINE YELLOW 08/08/2016 1008   APPEARANCEUR CLEAR 08/08/2016 1008   LABSPEC 1.009 08/08/2016 1008   PHURINE 6.0 08/08/2016 1008   GLUCOSEU NEGATIVE 08/08/2016 1008   HGBUR NEGATIVE 08/08/2016 1008   BILIRUBINUR NEGATIVE 08/08/2016 1008   KETONESUR NEGATIVE 08/08/2016 1008   PROTEINUR NEGATIVE 08/08/2016 1008   NITRITE NEGATIVE 08/08/2016 1008   LEUKOCYTESUR NEGATIVE 08/08/2016 1008   Sepsis Labs: @LABRCNTIP (procalcitonin:4,lacticidven:4)  )No results found for this or any previous visit (from the past 240 hour(s)).       Radiology Studies: Dg Chest 2 View  Result Date: 08/08/2016 CLINICAL DATA:  Fever and shortness of breath EXAM: CHEST  2 VIEW COMPARISON:  August 09, 2010. FINDINGS: There is no edema or consolidation. The heart size and pulmonary vascularity are normal. No adenopathy. No bone lesions. IMPRESSION: No edema or consolidation. Electronically Signed   By: Bretta BangWilliam  Woodruff III M.D.   On: 08/08/2016 10:27   Mr Neck Soft Tissue Only W Wo Contrast  Result Date: 08/09/2016 CLINICAL DATA:  56 year old female with neck stiffness and severe neck pain. Initial encounter. EXAM: MRI OF THE NECK WITH CONTRAST TECHNIQUE: Multiplanar, multisequence MR imaging was performed following the administration of intravenous contrast. COMPARISON:  None.  FINDINGS: Pharynx and larynx: Negative larynx and pharynx. Negative parapharyngeal and retropharyngeal spaces. Salivary glands: Negative sublingual space, submandibular glands, sublingual glands, and parotid glands. Thyroid: Negative. Lymph nodes: No cervical lymphadenopathy. No focal inflammatory soft tissue changes identified on either side of the neck. Vascular: Major vascular flow voids in the neck are preserved. Limited intracranial: Negative. Visualized orbits: Not included. Mastoids and visualized paranasal sinuses: Clear. Skeleton: Visualized bone marrow signal is within normal limits. No marrow edema or evidence of acute osseous abnormality. No cervical spinal stenosis or significant disc degeneration. Mild left C4 neural foraminal stenosis related to uncovertebral hypertrophy. Visible spinal cord signal appears normal. No abnormal intradural enhancement. Upper chest: Trace layering pleural effusions, otherwise negative. No superior mediastinal  lymphadenopathy. IMPRESSION: 1. No acute or inflammatory process identified. Normal MRI appearance of the neck soft tissues. 2. Minimal to mild for age cervical spine degeneration. No cervical spinal stenosis. Mild left C4 neural foraminal stenosis. Electronically Signed   By: Odessa Fleming M.D.   On: 08/09/2016 09:30        Scheduled Meds: . lidocaine      . acyclovir  550 mg Intravenous Q8H  . ampicillin (OMNIPEN) IV  2 g Intravenous Q4H  . cefTRIAXone (ROCEPHIN)  IV  2 g Intravenous Q12H  . levothyroxine  125 mcg Oral QAC breakfast  . methocarbamol  500 mg Oral TID  . pantoprazole  40 mg Oral Daily  . sodium chloride flush  3 mL Intravenous Q12H  . sodium fluoride   dental QHS  . vancomycin  750 mg Intravenous Q12H   Continuous Infusions: . sodium chloride 50 mL/hr at 08/09/16 1136     LOS: 1 day    Time spent: 57    Pleas Koch, MD [facility] ([facility]   If 7PM-7AM, please contact night-coverage [email or web address] Password  [email or web address] 08/09/2016, 2:54 PM

## 2016-08-10 DIAGNOSIS — K449 Diaphragmatic hernia without obstruction or gangrene: Secondary | ICD-10-CM

## 2016-08-10 DIAGNOSIS — Z885 Allergy status to narcotic agent status: Secondary | ICD-10-CM

## 2016-08-10 DIAGNOSIS — L0291 Cutaneous abscess, unspecified: Secondary | ICD-10-CM

## 2016-08-10 DIAGNOSIS — K222 Esophageal obstruction: Secondary | ICD-10-CM

## 2016-08-10 DIAGNOSIS — R509 Fever, unspecified: Secondary | ICD-10-CM

## 2016-08-10 DIAGNOSIS — R05 Cough: Secondary | ICD-10-CM

## 2016-08-10 DIAGNOSIS — R51 Headache: Secondary | ICD-10-CM

## 2016-08-10 DIAGNOSIS — R21 Rash and other nonspecific skin eruption: Secondary | ICD-10-CM

## 2016-08-10 DIAGNOSIS — Z881 Allergy status to other antibiotic agents status: Secondary | ICD-10-CM

## 2016-08-10 LAB — CBC WITH DIFFERENTIAL/PLATELET
BASOS PCT: 1 %
Basophils Absolute: 0 10*3/uL (ref 0.0–0.1)
EOS PCT: 3 %
Eosinophils Absolute: 0.2 10*3/uL (ref 0.0–0.7)
HEMATOCRIT: 29.8 % — AB (ref 36.0–46.0)
Hemoglobin: 10.2 g/dL — ABNORMAL LOW (ref 12.0–15.0)
Lymphocytes Relative: 13 %
Lymphs Abs: 0.7 10*3/uL (ref 0.7–4.0)
MCH: 28.7 pg (ref 26.0–34.0)
MCHC: 34.2 g/dL (ref 30.0–36.0)
MCV: 83.9 fL (ref 78.0–100.0)
MONOS PCT: 9 %
Monocytes Absolute: 0.5 10*3/uL (ref 0.1–1.0)
NEUTROS ABS: 4.3 10*3/uL (ref 1.7–7.7)
Neutrophils Relative %: 74 %
Platelets: 165 10*3/uL (ref 150–400)
RBC: 3.55 MIL/uL — ABNORMAL LOW (ref 3.87–5.11)
RDW: 12.3 % (ref 11.5–15.5)
WBC: 5.7 10*3/uL (ref 4.0–10.5)

## 2016-08-10 LAB — COMPREHENSIVE METABOLIC PANEL
ALBUMIN: 3.4 g/dL — AB (ref 3.5–5.0)
ALK PHOS: 33 U/L — AB (ref 38–126)
ALT: 16 U/L (ref 14–54)
AST: 18 U/L (ref 15–41)
Anion gap: 6 (ref 5–15)
BILIRUBIN TOTAL: 0.7 mg/dL (ref 0.3–1.2)
BUN: 6 mg/dL (ref 6–20)
CALCIUM: 8.1 mg/dL — AB (ref 8.9–10.3)
CO2: 25 mmol/L (ref 22–32)
Chloride: 105 mmol/L (ref 101–111)
Creatinine, Ser: 0.8 mg/dL (ref 0.44–1.00)
GFR calc Af Amer: >60 mL/min (ref >60)
GLUCOSE: 115 mg/dL — AB (ref 65–99)
Potassium: 3.4 mmol/L — ABNORMAL LOW (ref 3.5–5.1)
Sodium: 136 mmol/L (ref 135–145)
TOTAL PROTEIN: 6.4 g/dL — AB (ref 6.5–8.1)

## 2016-08-10 LAB — HERPES SIMPLEX VIRUS(HSV) DNA BY PCR
HSV 1 DNA: NEGATIVE
HSV 2 DNA: NEGATIVE

## 2016-08-10 LAB — RHEUMATOID FACTOR

## 2016-08-10 LAB — HIV ANTIBODY (ROUTINE TESTING W REFLEX): HIV Screen 4th Generation wRfx: NONREACTIVE

## 2016-08-10 LAB — HEPATITIS PANEL, ACUTE
HCV Ab: <0.1 s/co ratio (ref 0.0–0.9)
Hep A IgM: NEGATIVE
Hep B C IgM: NEGATIVE
Hepatitis B Surface Ag: NEGATIVE

## 2016-08-10 LAB — ANA W/REFLEX IF POSITIVE: ANA: NEGATIVE

## 2016-08-10 LAB — VDRL, CSF: SYPHILIS VDRL QUANT CSF: NONREACTIVE

## 2016-08-10 LAB — TSH: TSH: 0.875 u[IU]/mL (ref 0.350–4.500)

## 2016-08-10 MED ORDER — PROMETHAZINE HCL 25 MG/ML IJ SOLN
25.0000 mg | Freq: Four times a day (QID) | INTRAMUSCULAR | Status: DC | PRN
  Administered 2016-08-10: 25 mg via INTRAVENOUS
  Filled 2016-08-10: qty 1

## 2016-08-10 MED ORDER — PROMETHAZINE HCL 25 MG/ML IJ SOLN: 12.5000 mg | Freq: Four times a day (QID) | INTRAMUSCULAR | Status: DC | PRN

## 2016-08-10 MED ORDER — KETOROLAC TROMETHAMINE 30 MG/ML IJ SOLN: 30.0000 mg | Freq: Once | INTRAMUSCULAR | Status: DC

## 2016-08-10 MED ORDER — DIPHENHYDRAMINE HCL 50 MG/ML IJ SOLN
12.5000 mg | Freq: Once | INTRAMUSCULAR | Status: AC
  Administered 2016-08-10: 12.5 mg via INTRAVENOUS
  Filled 2016-08-10: qty 1

## 2016-08-10 MED ORDER — FLUCONAZOLE IN SODIUM CHLORIDE 400-0.9 MG/200ML-% IV SOLN
400.0000 mg | INTRAVENOUS | Status: DC
  Administered 2016-08-10 – 2016-08-12 (×3): 400 mg via INTRAVENOUS
  Filled 2016-08-10 (×3): qty 200

## 2016-08-10 MED ORDER — PROCHLORPERAZINE EDISYLATE 5 MG/ML IJ SOLN: 10.0000 mg | Freq: Once | INTRAMUSCULAR | Status: DC

## 2016-08-10 MED ORDER — ISOMETHEPTENE-DICHLORAL-APAP 65-100-325 MG PO CAPS
2.0000 | ORAL_CAPSULE | ORAL | Status: DC | PRN
  Administered 2016-08-10 – 2016-08-12 (×6): 2 via ORAL
  Filled 2016-08-10 (×7): qty 2

## 2016-08-10 MED ORDER — METOCLOPRAMIDE HCL 5 MG PO TABS
5.0000 mg | ORAL_TABLET | Freq: Three times a day (TID) | ORAL | Status: DC
  Administered 2016-08-10 – 2016-08-13 (×10): 5 mg via ORAL
  Filled 2016-08-10 (×10): qty 1

## 2016-08-10 NOTE — Progress Notes (Signed)
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  LP to R/o meningitis non specific with no white cells- received in ED at [facility] vanc/cefepime-started on broad coverage initally-Seen by ID Dr. Hatcher-d/c Vanc and Cefepime 3/30-continuing Acyclovir and added Fluconazole for concern ? Coccidioides.  Follow tickborne illness serology.  blood cult 3.29 is neg  work-up to include AI processes--ESR neg/ CRP neg/ RA neg.  HIV neg.  Hepatitis panel pending. D/w Neuro--will help manage Headaches-at this juncure feels no need for further Neuro work-up.  D/c droplet precautions.    Neuropathy-ordered Clonazepam 1 gm tid--She also has is on Dilaudid 1 mg q 2 prn, and also added Mirdrin q4 for HA.  She has been declining to use clonazapam  GERD-cont Protonix 40 daily  Nausea-adding phenergan  lovenox dnar Discussed with husband and other relative at bedside inpatient   Consultants:   None yet  Procedures:   none  Antimicrobials:   vanc 3/28  Cefepime 3/28  Ampicillin 3/28  acyclovir 3/28   Subjective:  Sleepy today and just received phenergan as had Vomit x 2  Revisited room 2:16 PM and not any better-stll throbbing clamping type headache across head and ears without radiation Patient asking about alternatives for  HA Robaxin has not helped and patient hesitant to use dilaudid   Objective: Vitals:   08/09/16 1242 08/09/16 1515 08/09/16 2149 08/10/16 0515  BP: 119/73 137/76 (!) 121/53 124/73  Pulse: 84 82 85 84  Resp: 16 16 18 16   Temp: 98.1 F (36.7 C) 99.2 F (37.3 C) 99.6 F (37.6 C) 99.1 F (37.3 C)  TempSrc: Oral Oral Oral Oral  SpO2: 97% 98% 95% 96%  Weight:      Height:        Intake/Output Summary (Last 24 hours) at 08/10/16 0734 Last data filed at 08/10/16 0200  Gross per 24 hour  Intake             1638 ml  Output                0 ml  Net             1638 ml   Filed Weights   08/08/16 0940 08/08/16 1517  Weight: 59 kg (130 lb) 59 kg (130 lb)    Examination:  General exam:  Uncomfortable, somnolent but rousable, mucosa slight dry-warm to touch Respiratory system: Clear to auscultation. Respiratory effort normal.  Pupils track equally no facial drrop Cardiovascular system: S1 & S2 heard, RRR. No JVD, murmurs, rubs, gallops or clicks. No pedal edema. Gastrointestinal system: Abdomen is nondistended, soft and nontender.  Central nervous system: Alert and oriented.  Extremities: Symmetric 5 x 5 power. Skin:  Skin with rash to forearms and LE's with some swellinh.  Tenderness in R forearm is gone  Data Reviewed: I have personally reviewed following labs and imaging studies  CBC:  Recent Labs Lab 08/08/16 1038 08/08/16 1722 08/09/16 0621 08/10/16 0554  WBC 6.2 6.3 6.8 5.7  NEUTROABS 4.4 4.3  --  4.3  HGB 13.0 11.7* 11.0* 10.2*  HCT 37.2 33.9* 31.8* 29.8*  MCV 86.7 87.4 87.4 83.9  PLT 186 164 181 300   Basic Metabolic Panel:  Recent Labs Lab 08/08/16 1038 08/08/16 1722 08/09/16 0621 08/10/16 0554  NA 140 139 137 136  K 3.8 3.4* 3.5 3.4*  CL 109 107 105 105  CO2 26 25 27 25   GLUCOSE 114* 75 101* 115*  BUN 12 9 7 6   CREATININE 0.88 0.79 0.89 0.80  CALCIUM 9.2 8.4* 8.1* 8.1*   GFR: Estimated Creatinine Clearance: 69.4 mL/min (by C-G formula based  on SCr of 0.8 mg/dL). Liver Function Tests:  Recent Labs Lab 08/08/16 1038 08/08/16 1722 08/09/16 0621 08/10/16 0554  AST 20 16 17 18   ALT 21 19 17 16   ALKPHOS 45 36* 37* 33*  BILITOT 0.8 0.6 0.8 0.7  PROT 7.6 6.7 6.4* 6.4*  ALBUMIN 4.1 3.7 3.5 3.4*   No results for input(s): LIPASE, AMYLASE in the last 168 hours. No results for input(s): AMMONIA in the last 168 hours. Coagulation Profile:  Recent Labs Lab 08/08/16 1108 08/09/16 0621  INR 0.98 1.06   Cardiac Enzymes: No results for input(s): CKTOTAL, CKMB, CKMBINDEX, TROPONINI in the last 168 hours. BNP (last 3 results) No results for input(s): PROBNP in the last 8760 hours. HbA1C: No results for input(s): HGBA1C in the last 72 hours. CBG: No results for input(s): GLUCAP in the last 168 hours. Lipid Profile: No results for input(s): CHOL, HDL, LDLCALC, TRIG, CHOLHDL, LDLDIRECT in the last 72 hours. Thyroid Function Tests: No results for input(s): TSH, T4TOTAL, FREET4, T3FREE, THYROIDAB in the last 72 hours. Anemia Panel: No results for input(s): VITAMINB12, FOLATE, FERRITIN, TIBC, IRON, RETICCTPCT in the last 72 hours. Urine analysis:    Component Value Date/Time   COLORURINE YELLOW 08/08/2016 1008   APPEARANCEUR CLEAR 08/08/2016 1008   LABSPEC 1.009 08/08/2016 1008   PHURINE 6.0 08/08/2016 1008   GLUCOSEU NEGATIVE 08/08/2016 1008   HGBUR NEGATIVE 08/08/2016 1008   BILIRUBINUR NEGATIVE 08/08/2016 1008   KETONESUR NEGATIVE 08/08/2016 1008   PROTEINUR NEGATIVE 08/08/2016 1008   NITRITE NEGATIVE 08/08/2016 1008   LEUKOCYTESUR NEGATIVE 08/08/2016 1008   Sepsis Labs: @LABRCNTIP (procalcitonin:4,lacticidven:4)  ) Recent Results (from the past 240 hour(s))  Blood culture (routine x 2)     Status: None (Preliminary result)   Collection Time: 08/08/16 11:08 AM  Result Value Ref Range Status   Specimen Description BLOOD RIGHT ANTECUBITAL  Final   Special Requests BOTTLES DRAWN AEROBIC AND ANAEROBIC 8CC  Final    Culture   Final    NO GROWTH 1 DAY Performed at [facility], [place] [address] [place], [place] [address]    Report Status PENDING  Incomplete  Blood culture (routine x 2)     Status: None (Preliminary result)   Collection Time: 08/08/16 11:32 AM  Result Value Ref Range Status   Specimen Description BLOOD LEFT ANTECUBITAL  Final   Special Requests BOTTLES DRAWN AEROBIC AND ANAEROBIC 10CC  Final   Culture   Final    NO GROWTH 1 DAY Performed at [facility], [place] [address], [place], [place] [address]    Report Status PENDING  Incomplete  Culture, blood (routine x 2)  Status: None (Preliminary result)   Collection Time: 08/08/16  5:22 PM  Result Value Ref Range Status   Specimen Description BLOOD LEFT ANTECUBITAL  Final   Special Requests BOTTLES DRAWN AEROBIC AND ANAEROBIC 6CC  Final   Culture   Final    NO GROWTH < 24 HOURS Performed at [facility], [place] [address], [place], [place] [address]    Report Status PENDING  Incomplete  Culture, blood (routine x 2)     Status: None (Preliminary result)   Collection Time: 08/09/16  6:27 AM  Result Value Ref Range Status   Specimen Description BLOOD LEFT ANTECUBITAL  Final   Special Requests BOTTLES DRAWN AEROBIC AND ANAEROBIC 5CC  Final   Culture   Final    NO GROWTH < 12 HOURS Performed at [facility], [place] [address] [place], [place] [address]    Report Status PENDING  Incomplete         Radiology Studies: Dg Chest 2 View  Result Date: 08/08/2016 CLINICAL DATA:  Fever and shortness of breath EXAM: CHEST  2 VIEW COMPARISON:  August 09, 2010. FINDINGS: There is no edema or consolidation. The heart size and pulmonary vascularity are normal. No adenopathy. No bone lesions. IMPRESSION: No edema or consolidation. Electronically Signed   By: Lowella Grip III M.D.   On: 08/08/2016 10:27   Mr Neck Soft Tissue Only W Wo Contrast  Result Date: 08/09/2016 CLINICAL DATA:  56 year old female with neck  stiffness and severe neck pain. Initial encounter. EXAM: MRI OF THE NECK WITH CONTRAST TECHNIQUE: Multiplanar, multisequence MR imaging was performed following the administration of intravenous contrast. COMPARISON:  None. FINDINGS: Pharynx and larynx: Negative larynx and pharynx. Negative parapharyngeal and retropharyngeal spaces. Salivary glands: Negative sublingual space, submandibular glands, sublingual glands, and parotid glands. Thyroid: Negative. Lymph nodes: No cervical lymphadenopathy. No focal inflammatory soft tissue changes identified on either side of the neck. Vascular: Major vascular flow voids in the neck are preserved. Limited intracranial: Negative. Visualized orbits: Not included. Mastoids and visualized paranasal sinuses: Clear. Skeleton: Visualized bone marrow signal is within normal limits. No marrow edema or evidence of acute osseous abnormality. No cervical spinal stenosis or significant disc degeneration. Mild left C4 neural foraminal stenosis related to uncovertebral hypertrophy. Visible spinal cord signal appears normal. No abnormal intradural enhancement. Upper chest: Trace layering pleural effusions, otherwise negative. No superior mediastinal lymphadenopathy. IMPRESSION: 1. No acute or inflammatory process identified. Normal MRI appearance of the neck soft tissues. 2. Minimal to mild for age cervical spine degeneration. No cervical spinal stenosis. Mild left C4 neural foraminal stenosis. Electronically Signed   By: Genevie Ann M.D.   On: 08/09/2016 09:30   Dg Fluoro Guide Lumbar Puncture  Result Date: 08/09/2016 CLINICAL DATA:  Meningitis EXAM: DIAGNOSTIC LUMBAR PUNCTURE UNDER FLUOROSCOPIC GUIDANCE FLUOROSCOPY TIME:  Fluoroscopy Time:  0 minutes 23 seconds Radiation Exposure Index (if provided by the fluoroscopic device): Number of Acquired Spot Images: 0 PROCEDURE: Informed consent was obtained from the patient prior to the procedure, including potential complications of headache,  allergy, and pain. With the patient prone, the lower back was prepped with Betadine. 1% Lidocaine was used for local anesthesia. Lumbar puncture was performed at the L4-5 level using a 20 gauge needle with return of clear CSF with an opening pressure of 16 cm water. Fourteen ml of CSF were obtained for laboratory studies. The patient tolerated the procedure well and there were no apparent complications. IMPRESSION: Successful lumbar puncture using fluoroscopic  guidance. Electronically Signed   By: Franchot Gallo M.D.   On: 08/09/2016 16:06        Scheduled Meds: . acyclovir  550 mg Intravenous Q8H  . ampicillin (OMNIPEN) IV  2 g Intravenous Q4H  . cefTRIAXone (ROCEPHIN)  IV  2 g Intravenous Q12H  . levothyroxine  125 mcg Oral QAC breakfast  . methocarbamol  500 mg Oral TID  . pantoprazole  40 mg Oral Daily  . sodium chloride flush  3 mL Intravenous Q12H  . sodium fluoride   dental QHS  . vancomycin  750 mg Intravenous Q12H   Continuous Infusions: . sodium chloride 50 mL/hr at 08/09/16 1136     LOS: 2 days    Time spent: [place], MD [facility]   If 7PM-7AM, please contact night-coverage [email or web address] Password [facility] 08/10/2016, 7:34 AM

## 2016-08-10 NOTE — Consult Note (Addendum)
[facility]  Date of Admission:  08/08/2016  Date of Consult:  08/10/2016  Reason for Consult: fever, neck pain Referring Physician: Burgess Estelle  Impression/Recommendation Fever  Headaches Rash  Would: Stop antibiotics.  Check CSF for herpes virus (I have a low suspicion for this as she has not responded to acyclovir, these patients typically respond very quickly). We'll send serologies for fungal pathogens. Coccidioides would be a "classic" illness for someone who traveled to the to the [facility] and came back with headaches and a rash. We'll start her on fluconazole. Alternatively she spent very little time outside of the hotel and outside a [facility] proper while she was there. Check broad FUO workup (including mono-like illnesses, as well noninfectious causes such as hyperthyroidism, lupus, vasculitis, sarcoid).  Will send serologies for tickborne illnesses however have a very low suspicion for this. Lyme is not endemic in this community and if she had contracted [facility] spotted fever or ehrlichiosis she would most likely have becomes acutely ill and 2017. She was not anemic at the time of her presentation. We'll defer on further workup, such as a bone marrow biopsy, at this point.  Thank you so much for this interesting consult,   Bobby Rumpf (pager) [phone] [email or web address]  Kristen Dougherty is an 56 y.o. female.  HPI: 56 yo F with ~ 3 month hx of fever (up to 102) and neck stiffness.   She had a colonoscopy and upper endcoscopy which revealed a Schatzki ring and a hiatal hernia (February).  During this period she had a business trip to [facility] (March 8).  She had acute worsening over the last 5 days with n/v, rash as well. In addition she has developed photophobia. She came to ED on 3-28 and was started on vanco/cefepime/amp/acyclovir  She lives in [place]. She has 3 dogs. She is married. She denies any clear  sick exposures. 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  She also relates a history of a tick bite last fall of 2017.  CSF:  Prot 53 Glc 35 WBC/RBC 1/82 Crypto (-)   Blood:  ESR normal RF (-) HIV/Hep B/C (-)  MRI neck- soft tissue- normal    Past Medical History:  Diagnosis Date  . Hypothyroidism   . Insomnia   . Migraine   . Osteoporosis     Past Surgical History:  Procedure Laterality Date  . BREAST REDUCTION SURGERY  1990  . FACIAL FRACTURE SURGERY Left   . SHOULDER ARTHROSCOPY W/ ROTATOR CUFF REPAIR Right   . ULNAR NERVE REPAIR Right      Allergies  Allergen Reactions  . Morphine Nausea And Vomiting  . Azithromycin     REACTION: stomach upset  . Hydrocodone Other (See Comments)    migraine   . Vancomycin     Red Man Syndrome     Medications:  Scheduled: . acyclovir  550 mg Intravenous Q8H  . ampicillin (OMNIPEN) IV  2 g Intravenous Q4H  . cefTRIAXone (ROCEPHIN)  IV  2 g Intravenous Q12H  . levothyroxine  125 mcg Oral QAC breakfast  . methocarbamol  500 mg Oral TID  . metoCLOPramide  5 mg Oral TID AC  . pantoprazole  40 mg Oral Daily  . sodium chloride flush  3 mL Intravenous Q12H  . sodium fluoride   dental QHS  . vancomycin  750 mg Intravenous Q12H    Abtx:  Anti-infectives  Start     Dose/Rate Route Frequency Ordered Stop   08/08/16 2359  vancomycin (VANCOCIN) IVPB 750 mg/150 ml premix     750 mg 75 mL/hr over 120 Minutes Intravenous Every 12 hours 08/08/16 1731     08/08/16 2200  cefTRIAXone (ROCEPHIN) 2 g in dextrose 5 % 50 mL IVPB     2 g 100 mL/hr over 30 Minutes Intravenous Every 12 hours 08/08/16 1731     08/08/16 1800  ampicillin (OMNIPEN) 2 g in sodium chloride 0.9 % 50 mL IVPB     2 g 150 mL/hr over 20 Minutes Intravenous Every 4 hours 08/08/16 1711     08/08/16 1800  acyclovir (ZOVIRAX) 550 mg in dextrose 5 % 100 mL IVPB     550 mg 111 mL/hr over 60 Minutes Intravenous Every 8 hours 08/08/16  1731     08/08/16 1100  cefTRIAXone (ROCEPHIN) 2 g in dextrose 5 % 50 mL IVPB     2 g 100 mL/hr over 30 Minutes Intravenous  Once 08/08/16 1059 08/08/16 1400   08/08/16 1100  vancomycin (VANCOCIN) IVPB 1000 mg/200 mL premix     1,000 mg 200 mL/hr over 60 Minutes Intravenous  Once 08/08/16 1059 08/08/16 1306      Total days of antibiotics: 2 3/28 Rocephin >>  3/28 Vanc >>  3/28 Ampicillin >> 3/28 Acyclovir>>         Social History:  reports that she has never smoked. She has never used smokeless tobacco. She reports that she drinks alcohol. She reports that she does not use drugs.  Family History  Problem Relation Age of Onset  . Allergic rhinitis Neg Hx   . Angioedema Neg Hx   . Asthma Neg Hx   . Eczema Neg Hx   . Immunodeficiency Neg Hx   . Urticaria Neg Hx     General ROS: Rash+. Positive anorexia. Positive neck stiffness. Positive headache (retro-orbital, persistent) blurry vision with headaches. Positive shortness of breath and dyspnea on exertion. Positive nonproductive cough. Positive ear pain.  Please see HPI. 12 point ROS o/w (-)   Blood pressure 124/73, pulse 84, temperature 99.1 F (37.3 C), temperature source Oral, resp. rate 16, height 5' 4"  (1.626 m), weight 59 kg (130 lb), last menstrual period 07/12/2016, SpO2 96 %. General appearance: fatigued and mild distress Eyes: negative findings: pupils equal, round, reactive to light and accomodation and no photophobia.  Throat: lips, mucosa, and tongue normal; teeth and gums normal Neck: no adenopathy, supple, symmetrical, trachea midline and I am able to move her neck without resistance or rigidity.  Lungs: clear to auscultation bilaterally Heart: regular rate and rhythm Abdomen: normal findings: bowel sounds normal and soft, non-tender Extremities: edema trace Skin: erythema - generalized Lymph nodes: Cervical adenopathy: mild posterior auricular.  and Axillary adenopathy: none   Results for orders placed or  performed during the hospital encounter of 08/08/16 (from the past 48 hour(s))  Comprehensive metabolic panel     Status: Abnormal   Collection Time: 08/08/16  5:22 PM  Result Value Ref Range   Sodium 139 135 - 145 mmol/L   Potassium 3.4 (L) 3.5 - 5.1 mmol/L   Chloride 107 101 - 111 mmol/L   CO2 25 22 - 32 mmol/L   Glucose, Bld 75 65 - 99 mg/dL   BUN 9 6 - 20 mg/dL   Creatinine, Ser 0.79 0.44 - 1.00 mg/dL   Calcium 8.4 (L) 8.9 - 10.3 mg/dL   Total Protein 6.7  6.5 - 8.1 g/dL   Albumin 3.7 3.5 - 5.0 g/dL   AST 16 15 - 41 U/L   ALT 19 14 - 54 U/L   Alkaline Phosphatase 36 (L) 38 - 126 U/L   Total Bilirubin 0.6 0.3 - 1.2 mg/dL   GFR calc non Af Amer >60 >60 mL/min   GFR calc Af Amer >60 >60 mL/min    Comment: (NOTE) The eGFR has been calculated using the CKD EPI equation. This calculation has not been validated in all clinical situations. eGFR's persistently <60 mL/min signify possible Chronic Kidney Disease.    Anion gap 7 5 - 15  CBC with Differential     Status: Abnormal   Collection Time: 08/08/16  5:22 PM  Result Value Ref Range   WBC 6.3 4.0 - 10.5 K/uL   RBC 3.88 3.87 - 5.11 MIL/uL   Hemoglobin 11.7 (L) 12.0 - 15.0 g/dL   HCT 33.9 (L) 36.0 - 46.0 %   MCV 87.4 78.0 - 100.0 fL   MCH 30.2 26.0 - 34.0 pg   MCHC 34.5 30.0 - 36.0 g/dL   RDW 12.6 11.5 - 15.5 %   Platelets 164 150 - 400 K/uL   Neutrophils Relative % 69 %   Neutro Abs 4.3 1.7 - 7.7 K/uL   Lymphocytes Relative 18 %   Lymphs Abs 1.2 0.7 - 4.0 K/uL   Monocytes Relative 9 %   Monocytes Absolute 0.6 0.1 - 1.0 K/uL   Eosinophils Relative 3 %   Eosinophils Absolute 0.2 0.0 - 0.7 K/uL   Basophils Relative 1 %   Basophils Absolute 0.0 0.0 - 0.1 K/uL  HIV antibody     Status: None   Collection Time: 08/08/16  5:22 PM  Result Value Ref Range   HIV Screen 4th Generation wRfx Non Reactive Non Reactive    Comment: (NOTE) Performed At: [facility] [address] [place], [place] [id number] Lindon Romp  MD VO:[id number]   Culture, blood (routine x 2)     Status: None (Preliminary result)   Collection Time: 08/08/16  5:22 PM  Result Value Ref Range   Specimen Description BLOOD LEFT ANTECUBITAL    Special Requests BOTTLES DRAWN AEROBIC AND ANAEROBIC 6CC    Culture      NO GROWTH < 24 HOURS Performed at [facility], [address], [place], [place] [address]    Report Status PENDING   Comprehensive metabolic panel     Status: Abnormal   Collection Time: 08/09/16  6:21 AM  Result Value Ref Range   Sodium 137 135 - 145 mmol/L   Potassium 3.5 3.5 - 5.1 mmol/L   Chloride 105 101 - 111 mmol/L   CO2 27 22 - 32 mmol/L   Glucose, Bld 101 (H) 65 - 99 mg/dL   BUN 7 6 - 20 mg/dL   Creatinine, Ser 0.89 0.44 - 1.00 mg/dL   Calcium 8.1 (L) 8.9 - 10.3 mg/dL   Total Protein 6.4 (L) 6.5 - 8.1 g/dL   Albumin 3.5 3.5 - 5.0 g/dL   AST 17 15 - 41 U/L   ALT 17 14 - 54 U/L   Alkaline Phosphatase 37 (L) 38 - 126 U/L   Total Bilirubin 0.8 0.3 - 1.2 mg/dL   GFR calc non Af Amer >60 >60 mL/min   GFR calc Af Amer >60 >60 mL/min    Comment: (NOTE) The eGFR has been calculated using the CKD EPI equation. This calculation has not been  validated in all clinical situations. eGFR's persistently <60 mL/min signify possible Chronic Kidney Disease.    Anion gap 5 5 - 15  CBC     Status: Abnormal   Collection Time: 08/09/16  6:21 AM  Result Value Ref Range   WBC 6.8 4.0 - 10.5 K/uL   RBC 3.64 (L) 3.87 - 5.11 MIL/uL   Hemoglobin 11.0 (L) 12.0 - 15.0 g/dL   HCT 31.8 (L) 36.0 - 46.0 %   MCV 87.4 78.0 - 100.0 fL   MCH 30.2 26.0 - 34.0 pg   MCHC 34.6 30.0 - 36.0 g/dL   RDW 12.6 11.5 - 15.5 %   Platelets 181 150 - 400 K/uL  Protime-INR     Status: None   Collection Time: 08/09/16  6:21 AM  Result Value Ref Range   Prothrombin Time 13.8 11.4 - 15.2 seconds   INR 1.06   Culture, blood (routine x 2)     Status: None (Preliminary result)   Collection Time: 08/09/16  6:27 AM  Result Value Ref Range    Specimen Description BLOOD LEFT ANTECUBITAL    Special Requests BOTTLES DRAWN AEROBIC AND ANAEROBIC 5CC    Culture      NO GROWTH < 12 HOURS Performed at [facility], [place] [address] [place], [place] [address]    Report Status PENDING   Rheumatoid factor     Status: None   Collection Time: 08/09/16 11:00 AM  Result Value Ref Range   Rhuematoid fact SerPl-aCnc <10.0 0.0 - 13.9 IU/mL    Comment: (NOTE) Performed At: [facility] [place], [place] [id number] Lindon Romp MD VO:[id number]   Hepatitis panel, acute     Status: None   Collection Time: 08/09/16 11:00 AM  Result Value Ref Range   Hepatitis B Surface Ag Negative Negative   HCV Ab <0.1 0.0 - 0.9 s/co ratio    Comment: (NOTE)                                  Negative:     < 0.8                             Indeterminate: 0.8 - 0.9                                  Positive:     > 0.9 The CDC recommends that a positive HCV antibody result be followed up with a HCV Nucleic Acid Amplification test (993716). Performed At: [facility] [place], [place] [id number] Lindon Romp MD FB:[id number]    Hep A IgM Negative Negative   Hep B C IgM Negative Negative  HIV antibody     Status: None   Collection Time: 08/09/16 11:00 AM  Result Value Ref Range   HIV Screen 4th Generation wRfx Non Reactive Non Reactive    Comment: (NOTE) Performed At: [facility] [address] [place], [place] [id number] Lindon Romp MD WE:[id number]   Glucose, CSF     Status: None   Collection Time: 08/09/16  3:15 PM  Result Value Ref Range   Glucose, CSF 53 40 - 70 mg/dL  Protein, CSF     Status: None   Collection Time: 08/09/16  3:15 PM  Result  Value Ref Range   Total  Protein, CSF 35 15 - 45 mg/dL  CSF cell count with differential     Status: Abnormal   Collection Time: 08/09/16  3:15 PM  Result Value Ref Range   Tube # 1    Color, CSF COLORLESS COLORLESS   Appearance,  CSF CLEAR CLEAR   Supernatant NOT INDICATED    RBC Count, CSF 82 (H) 0 /cu mm   WBC, CSF 1 0 - 5 /cu mm   Other Cells, CSF TOO FEW TO COUNT, SMEAR AVAILABLE FOR REVIEW     Comment: RARE LYMPHOCYTE SEEN. CORRELATE WITH CYTOLOGY.   Cryptococcal antigen, CSF     Status: None   Collection Time: 08/09/16  3:15 PM  Result Value Ref Range   Crypto Ag NEGATIVE NEGATIVE   Cryptococcal Ag Titer NOT INDICATED NOT INDICATED    Comment: Performed at [facility], [address] [place], [place] [address]  CSF cell count with differential     Status: Abnormal   Collection Time: 08/09/16  3:15 PM  Result Value Ref Range   Tube # 4    Color, CSF COLORLESS COLORLESS   Appearance, CSF CLEAR CLEAR   Supernatant NOT INDICATED    RBC Count, CSF 7 (H) 0 /cu mm   WBC, CSF 0 0 - 5 /cu mm   Other Cells, CSF TOO FEW TO COUNT, SMEAR AVAILABLE FOR REVIEW     Comment: RARE LYMPHOCYTE SEEN. CORRELATE WITH CYTOLOGY.   CBC with Differential/Platelet     Status: Abnormal   Collection Time: 08/10/16  5:54 AM  Result Value Ref Range   WBC 5.7 4.0 - 10.5 K/uL   RBC 3.55 (L) 3.87 - 5.11 MIL/uL   Hemoglobin 10.2 (L) 12.0 - 15.0 g/dL   HCT 29.8 (L) 36.0 - 46.0 %   MCV 83.9 78.0 - 100.0 fL   MCH 28.7 26.0 - 34.0 pg   MCHC 34.2 30.0 - 36.0 g/dL   RDW 12.3 11.5 - 15.5 %   Platelets 165 150 - 400 K/uL   Neutrophils Relative % 74 %   Neutro Abs 4.3 1.7 - 7.7 K/uL   Lymphocytes Relative 13 %   Lymphs Abs 0.7 0.7 - 4.0 K/uL   Monocytes Relative 9 %   Monocytes Absolute 0.5 0.1 - 1.0 K/uL   Eosinophils Relative 3 %   Eosinophils Absolute 0.2 0.0 - 0.7 K/uL   Basophils Relative 1 %   Basophils Absolute 0.0 0.0 - 0.1 K/uL  Comprehensive metabolic panel     Status: Abnormal   Collection Time: 08/10/16  5:54 AM  Result Value Ref Range   Sodium 136 135 - 145 mmol/L   Potassium 3.4 (L) 3.5 - 5.1 mmol/L   Chloride 105 101 - 111 mmol/L   CO2 25 22 - 32 mmol/L   Glucose, Bld 115 (H) 65 - 99 mg/dL   BUN 6 6 -  20 mg/dL   Creatinine, Ser 0.80 0.44 - 1.00 mg/dL   Calcium 8.1 (L) 8.9 - 10.3 mg/dL   Total Protein 6.4 (L) 6.5 - 8.1 g/dL   Albumin 3.4 (L) 3.5 - 5.0 g/dL   AST 18 15 - 41 U/L   ALT 16 14 - 54 U/L   Alkaline Phosphatase 33 (L) 38 - 126 U/L   Total Bilirubin 0.7 0.3 - 1.2 mg/dL   GFR calc non Af Amer >60 >60 mL/min   GFR calc Af Amer >60 >60 mL/min  Comment: (NOTE) The eGFR has been calculated using the CKD EPI equation. This calculation has not been validated in all clinical situations. eGFR's persistently <60 mL/min signify possible Chronic Kidney Disease.    Anion gap 6 5 - 15      Component Value Date/Time   SDES BLOOD LEFT ANTECUBITAL 08/09/2016 0627   SPECREQUEST BOTTLES DRAWN AEROBIC AND ANAEROBIC 5CC 08/09/2016 0627   CULT  08/09/2016 0627    NO GROWTH < 12 HOURS Performed at [facility], [place] [address], [place], [place] [address]    REPTSTATUS PENDING 08/09/2016 6301   Mr Neck Soft Tissue Only W Wo Contrast  Result Date: 08/09/2016 CLINICAL DATA:  56 year old female with neck stiffness and severe neck pain. Initial encounter. EXAM: MRI OF THE NECK WITH CONTRAST TECHNIQUE: Multiplanar, multisequence MR imaging was performed following the administration of intravenous contrast. COMPARISON:  None. FINDINGS: Pharynx and larynx: Negative larynx and pharynx. Negative parapharyngeal and retropharyngeal spaces. Salivary glands: Negative sublingual space, submandibular glands, sublingual glands, and parotid glands. Thyroid: Negative. Lymph nodes: No cervical lymphadenopathy. No focal inflammatory soft tissue changes identified on either side of the neck. Vascular: Major vascular flow voids in the neck are preserved. Limited intracranial: Negative. Visualized orbits: Not included. Mastoids and visualized paranasal sinuses: Clear. Skeleton: Visualized bone marrow signal is within normal limits. No marrow edema or evidence of acute osseous abnormality. No cervical spinal  stenosis or significant disc degeneration. Mild left C4 neural foraminal stenosis related to uncovertebral hypertrophy. Visible spinal cord signal appears normal. No abnormal intradural enhancement. Upper chest: Trace layering pleural effusions, otherwise negative. No superior mediastinal lymphadenopathy. IMPRESSION: 1. No acute or inflammatory process identified. Normal MRI appearance of the neck soft tissues. 2. Minimal to mild for age cervical spine degeneration. No cervical spinal stenosis. Mild left C4 neural foraminal stenosis. Electronically Signed   By: Genevie Ann M.D.   On: 08/09/2016 09:30   Dg Fluoro Guide Lumbar Puncture  Result Date: 08/09/2016 CLINICAL DATA:  Meningitis EXAM: DIAGNOSTIC LUMBAR PUNCTURE UNDER FLUOROSCOPIC GUIDANCE FLUOROSCOPY TIME:  Fluoroscopy Time:  0 minutes 23 seconds Radiation Exposure Index (if provided by the fluoroscopic device): Number of Acquired Spot Images: 0 PROCEDURE: Informed consent was obtained from the patient prior to the procedure, including potential complications of headache, allergy, and pain. With the patient prone, the lower back was prepped with Betadine. 1% Lidocaine was used for local anesthesia. Lumbar puncture was performed at the L4-5 level using a 20 gauge needle with return of clear CSF with an opening pressure of 16 cm water. Fourteen ml of CSF were obtained for laboratory studies. The patient tolerated the procedure well and there were no apparent complications. IMPRESSION: Successful lumbar puncture using fluoroscopic guidance. Electronically Signed   By: Franchot Gallo M.D.   On: 08/09/2016 16:06   Recent Results (from the past 240 hour(s))  Blood culture (routine x 2)     Status: None (Preliminary result)   Collection Time: 08/08/16 11:08 AM  Result Value Ref Range Status   Specimen Description BLOOD RIGHT ANTECUBITAL  Final   Special Requests BOTTLES DRAWN AEROBIC AND ANAEROBIC 8CC  Final   Culture   Final    NO GROWTH 1 DAY Performed at  [facility], [place] [address] [place], [place] [address]    Report Status PENDING  Incomplete  Blood culture (routine x 2)     Status: None (Preliminary result)   Collection Time: 08/08/16 11:32 AM  Result Value Ref Range Status  Specimen Description BLOOD LEFT ANTECUBITAL  Final   Special Requests BOTTLES DRAWN AEROBIC AND ANAEROBIC 10CC  Final   Culture   Final    NO GROWTH 1 DAY Performed at [facility], [place] [address] [place], [place] [address]    Report Status PENDING  Incomplete  Culture, blood (routine x 2)     Status: None (Preliminary result)   Collection Time: 08/08/16  5:22 PM  Result Value Ref Range Status   Specimen Description BLOOD LEFT ANTECUBITAL  Final   Special Requests BOTTLES DRAWN AEROBIC AND ANAEROBIC 6CC  Final   Culture   Final    NO GROWTH < 24 HOURS Performed at [facility], [place] [address], [place], [place] [address]    Report Status PENDING  Incomplete  Culture, blood (routine x 2)     Status: None (Preliminary result)   Collection Time: 08/09/16  6:27 AM  Result Value Ref Range Status   Specimen Description BLOOD LEFT ANTECUBITAL  Final   Special Requests BOTTLES DRAWN AEROBIC AND ANAEROBIC 5CC  Final   Culture   Final    NO GROWTH < 12 HOURS Performed at [facility], [place] [address] [place], [place] [address]    Report Status PENDING  Incomplete      08/10/2016, 12:29 PM     LOS: 2 days    Records and images were personally reviewed where available.

## 2016-08-10 NOTE — Consult Note (Signed)
NEURO HOSPITALIST CONSULT NOTE   Requestig physician: Dr. Mahala Menghini   Reason for Consult: HA   History obtained from:  Patient     HPI:                                                                                                                                          Kristen Dougherty is an 56 y.o. female he is a history of a almost 3 month period of on and off fever with headache and neck stiffness . Patient states that the headache often revealing the back of her neck causing neck stiffness and the headache starts in the back of her head wraps around the front of her head and then makes her weight to bilateral eyes. Patient states that she's had migraines but that was a long time ago doesn't recall the exact nature of her migraines. Since last Sunday she is developed a severe headache which caused nausea and vomiting along with generalized fatigue, arthralgias, and a rash throughout her body. Patient has had a significant workable in the hospital. She's had a lumbar puncture which was negative for any etiology. Cryptococcal antigen was negative, HIV was negative, multiple cultures are pending. Currently she has multiple complaints including photophobia, neck pain, shoulder pain, left forearm discomfort, right hip discomfort. Patient states that the Tylenol that she's been given does help slightly but only brings the headache down to approximately a 7 out of 10. Patient has been receiving dye lauded along with Zofran and Benadryl, Robaxin.   Past Medical History:  Diagnosis Date  . Hypothyroidism   . Insomnia   . Migraine   . Osteoporosis     Past Surgical History:  Procedure Laterality Date  . BREAST REDUCTION SURGERY  1990  . FACIAL FRACTURE SURGERY Left   . SHOULDER ARTHROSCOPY W/ ROTATOR CUFF REPAIR Right   . ULNAR NERVE REPAIR Right     Family History  Problem Relation Age of Onset  . Allergic rhinitis Neg Hx   . Angioedema Neg Hx   . Asthma Neg Hx   .  Eczema Neg Hx   . Immunodeficiency Neg Hx   . Urticaria Neg Hx       Social History:  reports that she has never smoked. She has never used smokeless tobacco. She reports that she drinks alcohol. She reports that she does not use drugs.  Allergies  Allergen Reactions  . Morphine Nausea And Vomiting  . Azithromycin     REACTION: stomach upset  . Hydrocodone Other (See Comments)    migraine   . Vancomycin     Red Man Syndrome     MEDICATIONS:  Prior to Admission:  Prescriptions Prior to Admission  Medication Sig Dispense Refill Last Dose  . acetaminophen (TYLENOL) 500 MG tablet Take 1,000 mg by mouth every 4 (four) hours as needed for mild pain, moderate pain, fever or headache.   08/07/2016 at Unknown time  . Biotin 5000 MCG TABS Take 10,000 mcg by mouth at bedtime.   08/07/2016 at Unknown time  . calcium-vitamin D (OSCAL WITH D) 500-200 MG-UNIT tablet Take 2 tablets by mouth at bedtime.   08/07/2016 at Unknown time  . clonazePAM (KLONOPIN) 1 MG tablet Take 1 mg by mouth at bedtime. For muscle spasms and sleep  0 08/07/2016 at Unknown time  . cyanocobalamin (,VITAMIN B-12,) 1000 MCG/ML injection INJECT 1 MILLILITER EVERY MONTH  0 05/2016  . levothyroxine (SYNTHROID, LEVOTHROID) 125 MCG tablet Take 125 mcg by mouth daily before breakfast.   08/08/2016 at Unknown time  . pantoprazole (PROTONIX) 40 MG tablet Take 40 mg by mouth 2 (two) times daily.   08/07/2016 at Unknown time  . sodium fluoride (SF) 1.1 % GEL dental gel Place 1 application onto teeth 2 (two) times daily.    08/08/2016 at am  . zolpidem (AMBIEN) 10 MG tablet Take 10 mg by mouth at bedtime.   08/07/2016 at Unknown time  . ranitidine (ZANTAC) 150 MG tablet Take 1 tablet (150 mg total) by mouth 2 (two) times daily. (Patient not taking: Reported on 08/08/2016) 60 tablet 5 Not Taking at Unknown time   Scheduled: .  acyclovir  550 mg Intravenous Q8H  . ampicillin (OMNIPEN) IV  2 g Intravenous Q4H  . cefTRIAXone (ROCEPHIN)  IV  2 g Intravenous Q12H  . levothyroxine  125 mcg Oral QAC breakfast  . methocarbamol  500 mg Oral TID  . metoCLOPramide  5 mg Oral TID AC  . pantoprazole  40 mg Oral Daily  . sodium chloride flush  3 mL Intravenous Q12H  . sodium fluoride   dental QHS  . vancomycin  750 mg Intravenous Q12H     ROS:                                                                                                                                       History obtained from the patient  General ROS: positive for - chills, fatigue, fever, night sweats, weight  Psychological ROS: negative for - behavioral disorder, hallucinations, memory difficulties, mood swings or suicidal ideation Ophthalmic ROS: negative for - blurry vision, double vision, eye pain or loss of vision ENT ROS: negative for - epistaxis, nasal discharge, oral lesions, sore throat, tinnitus or vertigo Allergy and Immunology ROS: negative for - hives or itchy/watery eyes Hematological and Lymphatic ROS: negative for - bleeding problems, bruising or swollen lymph nodes Endocrine ROS: negative for - galactorrhea, hair pattern changes, polydipsia/polyuria or temperature intolerance Respiratory ROS: negative for - cough, hemoptysis, shortness of breath or wheezing Cardiovascular ROS: negative for -  chest pain, dyspnea on exertion, edema or irregular heartbeat Gastrointestinal ROS: negative for - abdominal pain, diarrhea, hematemesis, nausea/vomiting or stool incontinence Genito-Urinary ROS: negative for - dysuria, hematuria, incontinence or urinary frequency/urgency Musculoskeletal ROS: positive for - joint swelling or muscular weakness Neurological ROS: as noted in HPI Dermatological ROS: negative for rash and skin lesion changes   Blood pressure 124/73, pulse 84, temperature 99.1 F (37.3 C), temperature source Oral, resp. rate 16,  height  (1.626 m), weight 59 kg (130 lb), last menstrual period 07/12/2016, SpO2 96 %.   Neurologic Examination:                                                                                                      HEENT-  Normocephalic, no lesions, without obvious abnormality.  Normal external eye and conjunctiva.  Normal TM's bilaterally.  Normal auditory canals and external ears. Normal external nose, mucus membranes and septum.  Normal pharynx. Cardiovascular- S1, S2 normal, pulses palpable throughout   Lungs- chest clear, no wheezing, rales, normal symmetric air entry Abdomen- normal findings: bowel sounds normal Extremities- no edema Lymph-no adenopathy palpable Musculoskeletal-no joint tenderness, deformity or swelling Skin-warm and dry, no hyperpigmentation, vitiligo, or suspicious lesions  Neurological Examination Mental Status: Alert, oriented, thought content appropriate.  Speech fluent without evidence of aphasia.  Able to follow 3 step commands without difficulty. Cranial Nerves: II: Discs flat bilaterally; Visual fields grossly normal, pupils equal, round, reactive to light and accommodation III,IV, VI: ptosis not present, extra-ocular motions intact bilaterally V,VII: smile symmetric, facial light touch sensation normal bilaterally VIII: hearing normal bilaterally IX,X: uvula rises symmetrically XI: bilateral shoulder shrug XII: midline tongue extension Motor: Right : Upper extremity   5/5    Left:     Upper extremity   5/5  Lower extremity   5/5     Lower extremity   5/5 Tone and bulk:normal tone throughout; no atrophy noted Sensory: Pinprick and light touch intact throughout, bilaterally Deep Tendon Reflexes: 2+ and symmetric throughout Plantars: Right: downgoing   Left: downgoing Cerebellar: normal finger-to-nose, normal rapid alternating movements and normal heel-to-shin test Gait: normal gait and station      Lab Results: Basic Metabolic  Panel:  Recent Labs Lab 08/08/16 1038 08/08/16 1722 08/09/16 0621 08/10/16 0554  NA 140 139 137 136  K 3.8 3.4* 3.5 3.4*  CL 109 107 105 105  CO2 GLUCOSE 114* 75 101* 115*  BUN CREATININE 0.88 0.79 0.89 0.80  CALCIUM 9.2 8.4* 8.1* 8.1*    Liver Function Tests:  Recent Labs Lab 08/08/16 1038 08/08/16 1722 08/09/16 0621 08/10/16 0554  AST ALT ALKPHOS 45 36* 37* 33*  BILITOT 0.8 0.6 0.8 0.7  PROT 7.6 6.7 6.4* 6.4*  ALBUMIN 4.1 3.7 3.5 3.4*   No results for input(s): LIPASE, AMYLASE in the last 168 hours. No results for input(s): AMMONIA in the last 168 hours.  CBC:  Recent Labs Lab 08/08/16 1038 08/08/16 1722 08/09/16 1610  08/10/16 0554  WBC 6.2 6.3 6.8 5.7  NEUTROABS 4.4 4.3  --  4.3  HGB 13.0 11.7* 11.0* 10.2*  HCT 37.2 33.9* 31.8* 29.8*  MCV 86.7 87.4 87.4 83.9  PLT 186 164 181 165    Cardiac Enzymes: No results for input(s): CKTOTAL, CKMB, CKMBINDEX, TROPONINI in the last 168 hours.  Lipid Panel: No results for input(s): CHOL, TRIG, HDL, CHOLHDL, VLDL, LDLCALC in the last 168 hours.  CBG: No results for input(s): GLUCAP in the last 168 hours.  Microbiology: Results for orders placed or performed during the hospital encounter of 08/08/16  Blood culture (routine x 2)     Status: None (Preliminary result)   Collection Time: 08/08/16 11:08 AM  Result Value Ref Range Status   Specimen Description BLOOD RIGHT ANTECUBITAL  Final   Special Requests BOTTLES DRAWN AEROBIC AND ANAEROBIC 8CC  Final   Culture   Final    NO GROWTH 1 DAY Performed at [facility], [address], [place], [place] [address]    Report Status PENDING  Incomplete  Blood culture (routine x 2)     Status: None (Preliminary result)   Collection Time: 08/08/16 11:32 AM  Result Value Ref Range Status   Specimen Description BLOOD LEFT ANTECUBITAL  Final   Special Requests BOTTLES DRAWN AEROBIC AND ANAEROBIC 10CC  Final    Culture   Final    NO GROWTH 1 DAY Performed at [facility], [address] [place], [place] [address]    Report Status PENDING  Incomplete  Culture, blood (routine x 2)     Status: None (Preliminary result)   Collection Time: 08/08/16  5:22 PM  Result Value Ref Range Status   Specimen Description BLOOD LEFT ANTECUBITAL  Final   Special Requests BOTTLES DRAWN AEROBIC AND ANAEROBIC 6CC  Final   Culture   Final    NO GROWTH < 24 HOURS Performed at [facility], [address], [place], [place] [address]    Report Status PENDING  Incomplete  Culture, blood (routine x 2)     Status: None (Preliminary result)   Collection Time: 08/09/16  6:27 AM  Result Value Ref Range Status   Specimen Description BLOOD LEFT ANTECUBITAL  Final   Special Requests BOTTLES DRAWN AEROBIC AND ANAEROBIC 5CC  Final   Culture   Final    NO GROWTH < 12 HOURS Performed at [facility], [address], [place], [place] [address]    Report Status PENDING  Incomplete    Coagulation Studies:  Recent Labs  08/08/16 1108 08/09/16 0621  LABPROT 13.0 13.8  INR 0.98 1.06    Imaging: Dg Chest 2 View  Result Date: 08/08/2016 CLINICAL DATA:  Fever and shortness of breath EXAM: CHEST  2 VIEW COMPARISON:  August 09, 2010. FINDINGS: There is no edema or consolidation. The heart size and pulmonary vascularity are normal. No adenopathy. No bone lesions. IMPRESSION: No edema or consolidation. Electronically Signed   By: Bretta Bang III M.D.   On: 08/08/2016 10:27   Mr Neck Soft Tissue Only W Wo Contrast  Result Date: 08/09/2016 CLINICAL DATA:  56 year old female with neck stiffness and severe neck pain. Initial encounter. EXAM: MRI OF THE NECK WITH CONTRAST TECHNIQUE: Multiplanar, multisequence MR imaging was performed following the administration of intravenous contrast. COMPARISON:  None. FINDINGS: Pharynx and larynx: Negative larynx and pharynx. Negative parapharyngeal and retropharyngeal  spaces. Salivary glands: Negative sublingual space, submandibular glands, sublingual glands,  and parotid glands. Thyroid: Negative. Lymph nodes: No cervical lymphadenopathy. No focal inflammatory soft tissue changes identified on either side of the neck. Vascular: Major vascular flow voids in the neck are preserved. Limited intracranial: Negative. Visualized orbits: Not included. Mastoids and visualized paranasal sinuses: Clear. Skeleton: Visualized bone marrow signal is within normal limits. No marrow edema or evidence of acute osseous abnormality. No cervical spinal stenosis or significant disc degeneration. Mild left C4 neural foraminal stenosis related to uncovertebral hypertrophy. Visible spinal cord signal appears normal. No abnormal intradural enhancement. Upper chest: Trace layering pleural effusions, otherwise negative. No superior mediastinal lymphadenopathy. IMPRESSION: 1. No acute or inflammatory process identified. Normal MRI appearance of the neck soft tissues. 2. Minimal to mild for age cervical spine degeneration. No cervical spinal stenosis. Mild left C4 neural foraminal stenosis. Electronically Signed   By: Odessa Fleming M.D.   On: 08/09/2016 09:30   Dg Fluoro Guide Lumbar Puncture  Result Date: 08/09/2016 CLINICAL DATA:  Meningitis EXAM: DIAGNOSTIC LUMBAR PUNCTURE UNDER FLUOROSCOPIC GUIDANCE FLUOROSCOPY TIME:  Fluoroscopy Time:  0 minutes 23 seconds Radiation Exposure Index (if provided by the fluoroscopic device): Number of Acquired Spot Images: 0 PROCEDURE: Informed consent was obtained from the patient prior to the procedure, including potential complications of headache, allergy, and pain. With the patient prone, the lower back was prepped with Betadine. 1% Lidocaine was used for local anesthesia. Lumbar puncture was performed at the L4-5 level using a 20 gauge needle with return of clear CSF with an opening pressure of 16 cm water. Fourteen ml of CSF were obtained for laboratory studies. The  patient tolerated the procedure well and there were no apparent complications. IMPRESSION: Successful lumbar puncture using fluoroscopic guidance. Electronically Signed   By: Marlan Palau M.D.   On: 08/09/2016 16:06       Assessment/Plan:  This is a 56 year old female with a three-month history of headache, arthralgias, neck discomfort, systemic body rash. Etiology at this time is unclear. From a neurological standpoint her headache is most likely secondary to the systemic illness that she is suffering from. At this time she has been getting analgesia which has been helping intermittently. Agree with obtaining infectious disease consult and continuing treatment of underlying arthralgias, neck pain, headache with analgesias and muscle relaxants while continuing to search for underlying etiology.  One consideration that can cause symptoms very much like this is tickborne illness. There are many types tickborne illness is throughout the [place]. And many these give systems such as: full body rash, neck stiffness, headache, nausea,weakness, muscle or joint pain or achiness and fever.  Dr. Amada Jupiter will see the patient and make an addendum to this note.   Felicie Morn PA-C [facility] [phone]  08/10/2016, 9:10 AM  I have seen the patient reviewed the above note. I agree that I think that her headache is secondary to her systemic process.  I do think it is reasonable to try Compazine/Toradol to see if it does help with her pain, but do not think that this is primary neurological.  I would favor treating with analgesia, with further workup of possible infectious sources per infectious disease. I think that rheumatological insight would also be valuable in a case such as hers.  1) Compazine/Benadryl/Toradol 2) At this time, I have no further recommendations, please call with further questions or concerns.    Ritta Slot, MD [facility] [phone]  If 7pm- 7am, please page neurology on call as listed in [facility].

## 2016-08-10 NOTE — Progress Notes (Signed)
Assumed care from [facility]. Agree with earlier assessment. No changes or complaints from patient. Melton Alar, RN

## 2016-08-11 ENCOUNTER — Inpatient Hospital Stay (HOSPITAL_COMMUNITY): Payer: 59

## 2016-08-11 DIAGNOSIS — M25512 Pain in left shoulder: Secondary | ICD-10-CM

## 2016-08-11 LAB — COMPREHENSIVE METABOLIC PANEL
ALT: 18 U/L (ref 14–54)
ANION GAP: 6 (ref 5–15)
AST: 21 U/L (ref 15–41)
Albumin: 3.1 g/dL — ABNORMAL LOW (ref 3.5–5.0)
Alkaline Phosphatase: 33 U/L — ABNORMAL LOW (ref 38–126)
BILIRUBIN TOTAL: 0.5 mg/dL (ref 0.3–1.2)
BUN: <5 mg/dL — ABNORMAL LOW (ref 6–20)
CO2: 25 mmol/L (ref 22–32)
Calcium: 7.9 mg/dL — ABNORMAL LOW (ref 8.9–10.3)
Chloride: 106 mmol/L (ref 101–111)
Creatinine, Ser: 0.71 mg/dL (ref 0.44–1.00)
GFR calc Af Amer: >60 mL/min (ref >60)
GFR calc non Af Amer: >60 mL/min (ref >60)
Glucose, Bld: 93 mg/dL (ref 65–99)
POTASSIUM: 3.3 mmol/L — AB (ref 3.5–5.1)
SODIUM: 137 mmol/L (ref 135–145)
TOTAL PROTEIN: 6 g/dL — AB (ref 6.5–8.1)

## 2016-08-11 LAB — TOXOPLASMA ANTIBODIES- IGG AND  IGM: Toxoplasma IgG Ratio: <3 IU/mL (ref 0.0–7.1)

## 2016-08-11 LAB — CMV IGM: CMV IGM: 40.5 [AU]/ml — AB (ref 0.0–29.9)

## 2016-08-11 LAB — CBC WITH DIFFERENTIAL/PLATELET
BASOS ABS: 0 10*3/uL (ref 0.0–0.1)
Basophils Relative: 0 %
Eosinophils Absolute: 0.2 10*3/uL (ref 0.0–0.7)
Eosinophils Relative: 4 %
HEMATOCRIT: 28.5 % — AB (ref 36.0–46.0)
Hemoglobin: 10 g/dL — ABNORMAL LOW (ref 12.0–15.0)
LYMPHS PCT: 24 %
Lymphs Abs: 1.3 10*3/uL (ref 0.7–4.0)
MCH: 30.5 pg (ref 26.0–34.0)
MCHC: 35.1 g/dL (ref 30.0–36.0)
MCV: 86.9 fL (ref 78.0–100.0)
Monocytes Absolute: 0.6 10*3/uL (ref 0.1–1.0)
Monocytes Relative: 11 %
NEUTROS ABS: 3.2 10*3/uL (ref 1.7–7.7)
Neutrophils Relative %: 61 %
PLATELETS: 158 10*3/uL (ref 150–400)
RBC: 3.28 MIL/uL — AB (ref 3.87–5.11)
RDW: 12.8 % (ref 11.5–15.5)
WBC: 5.3 10*3/uL (ref 4.0–10.5)

## 2016-08-11 LAB — PROTIME-INR
INR: 0.99
PROTHROMBIN TIME: 13.1 s (ref 11.4–15.2)

## 2016-08-11 LAB — CMV ANTIBODY, IGG (EIA): CMV AB - IGG: 6.5 U/mL — AB (ref 0.00–0.59)

## 2016-08-11 LAB — ANGIOTENSIN CONVERTING ENZYME: ANGIOTENSIN-CONVERTING ENZYME: 25 U/L (ref 14–82)

## 2016-08-11 LAB — EPSTEIN-BARR VIRUS VCA ANTIBODY PANEL
EBV Early Antigen Ab, IgG: <9 U/mL (ref 0.0–8.9)
EBV NA IgG: 33 U/mL — ABNORMAL HIGH (ref 0.0–17.9)
EBV VCA IgG: 280 U/mL — ABNORMAL HIGH (ref 0.0–17.9)
EBV VCA IgM: 151 U/mL — ABNORMAL HIGH (ref 0.0–35.9)

## 2016-08-11 LAB — HIV-1 RNA QUANT-NO REFLEX-BLD: LOG10 HIV-1 RNA: UNDETERMINED {Log_copies}/mL

## 2016-08-11 LAB — ANA W/REFLEX IF POSITIVE: ANA: NEGATIVE

## 2016-08-11 NOTE — Progress Notes (Signed)
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  LP to R/o meningitis non specific with no white cells- received in ED at [facility] vanc/cefepime-started on broad coverage initally-Seen by ID Dr. Hatcher-d/c Vanc and Cefepime 3/30-continuing Acyclovir and added Fluconazole for concern ? Coccidioides.  Follow tickborne illness serology.  blood cult 3.29 is neg  work-up to include AI processes--ESR neg/ CRP neg/ RA neg/HSV 1 and 2 negative/ HIV neg/Hepatitis panel negative. D/w Neuro--will help manage Headaches-at this juncture feels no need for further Neuro work-up.  D/c droplet precautions.   Still pending are ankle, Borrelli, CMV, erhlichia +? EBV titers but this may be the cause of her symptoms as these are positive with positive IgM and IgG--Rx is supportive-await ID input as this syndorm of rash, lymphadenopathy etc. Would fit  Neuropathy-ordered Clonazepam 1 gm tid--She also has is on Dilaudid 1 mg q 2 prn, and also added Mirdrin q4 for HA.  She has been declining to use clonazapam  GERD-cont Protonix 40 daily  Nausea-adding phenergan  lovenox dnar Discussed with husband and other relative at bedside inpatient   Consultants:   None yet  Procedures:   none  Antimicrobials:   vanc  3/28  Cefepime 3/28  Ampicillin 3/28  acyclovir 3/28   Subjective:  still with headache that seemed to respond to Midrin Overall looks a little better objectively but still feels poorly Has left upper shoulder pain Rash and slight swelling of extremities is still noted   Objective: Vitals:   08/10/16 1550 08/10/16 2133 08/11/16 0513 08/11/16 1450  BP: 132/67 130/72 (!) 108/58 138/67  Pulse: 96 84 78 80  Resp: _0 Temp: 98.7 F (37.1 C) 98.2 F (36.8 C) 98.8 F (37.1 C) 98.2 F (36.8 C)  TempSrc: Oral Oral Oral Oral  SpO2: 97% 99% 98% 100%  Weight:      Height:        Intake/Output Summary (Last 24 hours) at 08/11/16 1547 Last data filed at 08/11/16 1300  Gross per 24 hour  Intake           1239.5 ml  Output                0 ml  Net           1239.5 ml   Filed Weights   08/08/16 0940 08/08/16 1517  Weight: 59 kg (130 lb) 59 kg (130 lb)    Examination:  General exam: Arousable and more awake alert Respiratory system: Clear to auscultation. Respiratory effort normal.   Cardiovascular system: S1 & S2 heard, RRR. No JVD, murmurs, . Gastrointestinal system: Abdomen is nondistended, soft and nontender.  Central nervous system: Alert and oriented.  Extremities: Symmetric 5 x 5 power.  Skin:  Skin with rash to forearms seesm less, and LE's with some swelling.    Data Reviewed: I have personally reviewed following labs and imaging studies  CBC:  Recent Labs Lab 08/08/16 1038 08/08/16 1722 08/09/16 0621 08/10/16 0554 08/11/16 0536  WBC 6.2 6.3 6.8 5.7 5.3  NEUTROABS 4.4 4.3  --  4.3 3.2  HGB 13.0 11.7* 11.0* 10.2* 10.0*  HCT 37.2 33.9* 31.8* 29.8* 28.5*  MCV 86.7 87.4 87.4 83.9 86.9  PLT 186 164 181 165 786   Basic Metabolic Panel:  Recent Labs Lab 08/08/16 1038 08/08/16 1722 08/09/16 0621 08/10/16 0554 08/11/16 0536  NA 140 139 137 136 137  K 3.8 3.4* 3.5 3.4* 3.3*  CL 109 107 105 105 106  CO2 _0 GLUCOSE 114* 75 101*  115* 93  BUN _1 <5*  CREATININE 0.88 0.79 0.89 0.80 0.71  CALCIUM 9.2 8.4* 8.1* 8.1* 7.9*   GFR: Estimated Creatinine Clearance: 69.4 mL/min (by C-G formula based on SCr of 0.71 mg/dL). Liver Function Tests:  Recent Labs Lab 08/08/16 1038 08/08/16 1722 08/09/16 0621 08/10/16 0554 08/11/16 0536  AST _2 ALT _3 ALKPHOS 45 36* 37* 33* 33*  BILITOT 0.8 0.6 0.8 0.7 0.5  PROT 7.6 6.7 6.4* 6.4* 6.0*  ALBUMIN 4.1 3.7 3.5 3.4* 3.1*   No results for input(s): LIPASE, AMYLASE in the last 168 hours. No results for input(s): AMMONIA in the last 168 hours. Coagulation Profile:  Recent Labs Lab 08/08/16 1108 08/09/16 0621 08/11/16 0536  INR 0.98 1.06 0.99   Cardiac Enzymes: No results for input(s): CKTOTAL, CKMB, CKMBINDEX, TROPONINI in the last 168 hours. BNP (last 3 results) No results for input(s): PROBNP in the last 8760 hours. HbA1C: No results for input(s): HGBA1C in the last 72 hours. CBG: No results for input(s): GLUCAP in the last 168 hours. Lipid Profile: No results for input(s): CHOL, HDL, LDLCALC, TRIG, CHOLHDL, LDLDIRECT in the last 72 hours. Thyroid Function Tests:  Recent Labs  08/10/16 1403  TSH 0.875   Anemia Panel: No results for input(s): VITAMINB12, FOLATE, FERRITIN, TIBC, IRON, RETICCTPCT in the last 72 hours. Urine analysis:    Component Value Date/Time   COLORURINE YELLOW 08/08/2016 1008   APPEARANCEUR CLEAR 08/08/2016 1008   LABSPEC 1.009 08/08/2016 1008   PHURINE 6.0 08/08/2016 1008   GLUCOSEU NEGATIVE 08/08/2016 1008   HGBUR NEGATIVE 08/08/2016 1008   BILIRUBINUR NEGATIVE 08/08/2016 1008   KETONESUR NEGATIVE 08/08/2016 1008   PROTEINUR NEGATIVE 08/08/2016 1008   NITRITE NEGATIVE 08/08/2016 1008   LEUKOCYTESUR NEGATIVE 08/08/2016 1008   Sepsis Labs: _4 (procalcitonin:4,lacticidven:4)  ) Recent Results (from the past 240 hour(s))  Blood culture (routine x 2)     Status: None (Preliminary result)    Collection Time: 08/08/16 11:08 AM  Result Value Ref Range Status   Specimen Description BLOOD RIGHT ANTECUBITAL  Final   Special Requests BOTTLES DRAWN AEROBIC AND ANAEROBIC 8CC  Final   Culture   Final    NO GROWTH 3 DAYS Performed at [facility], [place] [address] [place], [place] [address]    Report Status PENDING  Incomplete  Blood culture (routine x 2)     Status: None (Preliminary result)   Collection Time: 08/08/16 11:32 AM  Result Value Ref Range Status   Specimen Description BLOOD LEFT ANTECUBITAL  Final   Special Requests BOTTLES DRAWN AEROBIC AND ANAEROBIC 10CC  Final  Culture   Final    NO GROWTH 3 DAYS Performed at [facility], [place] [address] [place], [place] [address]    Report Status PENDING  Incomplete  Culture, blood (routine x 2)     Status: None (Preliminary result)   Collection Time: 08/08/16  5:22 PM  Result Value Ref Range Status   Specimen Description BLOOD LEFT ANTECUBITAL  Final   Special Requests BOTTLES DRAWN AEROBIC AND ANAEROBIC 6CC  Final   Culture   Final    NO GROWTH 3 DAYS Performed at [facility], [place] [address], [place], [place] [address]    Report Status PENDING  Incomplete  Culture, blood (routine x 2)     Status: None (Preliminary result)   Collection Time: 08/09/16  6:27 AM  Result Value Ref Range Status   Specimen Description BLOOD LEFT ANTECUBITAL  Final   Special Requests BOTTLES DRAWN AEROBIC AND ANAEROBIC 5CC  Final   Culture   Final    NO GROWTH 2 DAYS Performed at [facility], [place] [address], [place], [place] [address]    Report Status PENDING  Incomplete  Anaerobic culture     Status: None (Preliminary result)   Collection Time: 08/09/16  3:15 PM  Result Value Ref Range Status   Specimen Description CSF  Final   Special Requests NONE  Final   Gram Stain   Final    NO WBC SEEN NO ORGANISMS SEEN CYTOSPIN SMEAR Performed at [facility], [place] [address], [place], [place] [address]     Culture   Final    NO ANAEROBES ISOLATED; CULTURE IN PROGRESS FOR 5 DAYS   Report Status PENDING  Incomplete  CSF culture     Status: None (Preliminary result)   Collection Time: 08/09/16  3:15 PM  Result Value Ref Range Status   Specimen Description CSF  Final   Special Requests NONE  Final   Gram Stain NO WBC SEEN NO ORGANISMS SEEN CYTOSPIN SMEAR   Final   Culture   Final    NO GROWTH 2 DAYS Performed at [facility], [place] [address] [place], [place] [address]    Report Status PENDING  Incomplete  Culture, fungus without smear     Status: None (Preliminary result)   Collection Time: 08/09/16  3:15 PM  Result Value Ref Range Status   Specimen Description CSF  Final   Special Requests NONE  Final   Culture   Final    NO FUNGUS ISOLATED AFTER 2 DAYS Performed at [facility], [place] [address], [place], [place] [address]    Report Status PENDING  Incomplete         Radiology Studies: Mr Brain Wo Contrast  Result Date: 08/11/2016 CLINICAL DATA:  Headache EXAM: MRI HEAD WITHOUT CONTRAST MRV HEAD WITHOUT CONTRAST TECHNIQUE: Multiplanar, multiecho pulse sequences of the brain and surrounding structures were obtained without intravenous contrast. Angiographic images of the intracranial venous structures were obtained using MRV technique without intravenous contrast. COMPARISON:  MRI head 01/19/2012 FINDINGS: Ventricle size and cerebral volume normal. Negative for acute or chronic infarction. Negative for hemorrhage, mass, or edema in the brain. Normal arterial flow voids. No skull lesion.  Paranasal sinuses clear.  Normal orbit. Superior sagittal sinus is widely patent. Transverse sinus patent bilaterally. Right transverse sinus is dominant. Right sigmoid sinus and jugular vein patent. Smaller left jugular vein which appears patent and has normal flow void on the axial  T2 weighted images of the brain. IMPRESSION: Normal MRI of the brain Normal MRV of the brain. Electronically  Signed   By: Franchot Gallo M.D.   On: 08/11/2016 12:56   Mr Annell Greening Head  Result Date: 08/11/2016 CLINICAL DATA:  Headache EXAM: MRI HEAD WITHOUT CONTRAST MRV HEAD WITHOUT CONTRAST TECHNIQUE: Multiplanar, multiecho pulse sequences of the brain and surrounding structures were obtained without intravenous contrast. Angiographic images of the intracranial venous structures were obtained using MRV technique without intravenous contrast. COMPARISON:  MRI head 01/19/2012 FINDINGS: Ventricle size and cerebral volume normal. Negative for acute or chronic infarction. Negative for hemorrhage, mass, or edema in the brain. Normal arterial flow voids. No skull lesion.  Paranasal sinuses clear.  Normal orbit. Superior sagittal sinus is widely patent. Transverse sinus patent bilaterally. Right transverse sinus is dominant. Right sigmoid sinus and jugular vein patent. Smaller left jugular vein which appears patent and has normal flow void on the axial T2 weighted images of the brain. IMPRESSION: Normal MRI of the brain Normal MRV of the brain. Electronically Signed   By: Franchot Gallo M.D.   On: 08/11/2016 12:56        Scheduled Meds: . acyclovir  550 mg Intravenous Q8H  . fluconazole (DIFLUCAN) IV  400 mg Intravenous Q24H  . ketorolac  30 mg Intravenous Once  . levothyroxine  125 mcg Oral QAC breakfast  . metoCLOPramide  5 mg Oral TID AC  . pantoprazole  40 mg Oral Daily  . prochlorperazine  10 mg Intravenous Once  . sodium chloride flush  3 mL Intravenous Q12H  . sodium fluoride   dental QHS   Continuous Infusions: . sodium chloride 50 mL/hr at 08/10/16 1424     LOS: 3 days    Time spent: [place], MD [facility] ([phone]   If 7PM-7AM, please contact night-coverage [email or web address] Password [facility] 08/11/2016, 3:47 PM

## 2016-08-12 MED ORDER — OXYCODONE HCL 5 MG PO TABS
5.0000 mg | ORAL_TABLET | ORAL | Status: DC | PRN
  Administered 2016-08-12 – 2016-08-13 (×2): 5 mg via ORAL
  Filled 2016-08-12 (×3): qty 1

## 2016-08-12 NOTE — Progress Notes (Signed)
Pharmacy Antibiotic Note  Kristen Dougherty is a 56 y.o. female admitted on 08/08/2016 with neck pain, rash, fever, HA-->starting abx for possible neck abscess vs. meningitis.  Pharmacy has been consulted for Acyclovir dosing.  08/12/2016  Scr 0.71, CrCl~59mls/min WBC WNL afebrile    Plan: Continue Acyclovir  (~10mg /kg AdjBW) IV q8h Monitor renal function, cx data, and patient progress  Height:  (162.6 cm) Weight: 130 lb (59 kg) IBW/kg (Calculated) : 54.7  Temp (24hrs), Avg:98.2 F (36.8 C), Min:98.1 F (36.7 C), Max:98.2 F (36.8 C)   Recent Labs Lab 08/08/16 1038 08/08/16 1114 08/08/16 1722 08/09/16 0621 08/10/16 0554 08/11/16 0536  WBC 6.2  --  6.3 6.8 5.7 5.3  CREATININE 0.88  --  0.79 0.89 0.80 0.71  LATICACIDVEN  --  1.21  --   --   --   --     Estimated Creatinine Clearance: 69.4 mL/min (by C-G formula based on SCr of 0.71 mg/dL).    Allergies  Allergen Reactions  . Morphine Nausea And Vomiting  . Azithromycin     REACTION: stomach upset  . Hydrocodone Other (See Comments)    migraine   . Vancomycin     Red Man Syndrome     Antimicrobials this admission: 3/28 Rocephin >> 3/30 3/28 Vanc >> 3/30 3/28 Ampicillin >>3/30 3/28 Acyclovir>> 3/30 fluconazole >>  Dose adjustments this admission:  Microbiology results: 3/28 BCx: ngtd 3/28 UA: negative 3/28 CSF cx: ordered 3/28 HIV: 3/28 HSV PCR:   Thank you for allowing pharmacy to be a part of this patient's care.  Arley Phenix RPh 08/12/2016, 1:25 PM Pager [phone]

## 2016-08-12 NOTE — Progress Notes (Signed)
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  LP to R/o meningitis non specific with no white cells- received in ED at [facility] vanc/cefepime-started on broad coverage initally-Seen by ID Dr. Hatcher-d/c Vanc and Cefepime 3/30--given that CMV and EBV titers are equivocal discontinued acyclovir and fluconazole 4/1. Follow tickborne illness serology.  blood cult 3.29 is neg  work-up to include AI processes--ESR neg/ CRP neg/ RA neg/HSV 1 and 2 negative/ HIV neg/Hepatitis panel negative. D/w Neuro--appreciate managent Headaches-no need for further Neuro work-up.  D/c droplet precautions.   Still pending are ankle, Borrellia  Headache-continue Midrin, continue Dilaudid 1 mg as last resort IV when necessary, started oxycodone 5 every 4 when necessary  Neuropathy-ordered Clonazepam 1 gm tid--  She has been declining to use clonazapam  GERD-cont Protonix 40 daily  Nausea-adding phenergan  lovenox dnar Discussed with husband and other relative at bedside inpatient   Consultants:   None yet  Procedures:   none  Antimicrobials:   vanc 3/28-3/30  Cefepime 3/28-3/30  Ampicillin 3/28-4/1  acyclovir 3/28-4/1  Fluconazole stopped/1   Subjective:  Overall looks much better Did not  sleep well last night Still has headache Lymphadenopathy noted in the posterior cervical chain No nausea no vomiting at present and feels it is related more to medications such as Dilaudid    Objective: Vitals:   08/11/16 1450 08/11/16 2022 08/12/16 0502 08/12/16 1451  BP: 138/67 119/68 134/79 127/80  Pulse: 80 80 80 77  Resp: _0 Temp: 98.2 F (36.8 C) 98.1 F (36.7 C) 98.2 F (36.8 C) 97.5 F (36.4 C)  TempSrc: Oral Oral Oral Oral  SpO2: 100% 99% 97% 97%  Weight:      Height:        Intake/Output Summary (Last 24 hours) at 08/12/16 1553 Last data filed at 08/12/16 0948  Gross per 24 hour  Intake             1646 ml  Output                0 ml  Net             1646 ml   Filed Weights   08/08/16 0940 08/08/16 1517  Weight: 59 kg (130 lb) 59 kg (130 lb)    Examination:  General exam: Arousable and more awake alert Post lymphadenopathy+.  Neck stiffness is better Respiratory system: Clear to auscultation. Respiratory effort normal.   Cardiovascular system: S1 & S2 heard, RRR. No JVD, murmurs, . Gastrointestinal system: Abdomen is nondistended, soft and nontender.  Central nervous system: Alert and oriented.  Extremities: Symmetric 5 x 5 power. Skin:   rash to forearms greatly decreased, less Arm and leg swelling.    Data  Reviewed: I have personally reviewed following labs and imaging studies  CBC:  Recent Labs Lab 08/08/16 1038 08/08/16 1722 08/09/16 0621 08/10/16 0554 08/11/16 0536  WBC 6.2 6.3 6.8 5.7 5.3  NEUTROABS 4.4 4.3  --  4.3 3.2  HGB 13.0 11.7* 11.0* 10.2* 10.0*  HCT 37.2 33.9* 31.8* 29.8* 28.5*  MCV 86.7 87.4 87.4 83.9 86.9  PLT 186 164 181 165 381   Basic Metabolic Panel:  Recent Labs Lab 08/08/16 1038 08/08/16 1722 08/09/16 0621 08/10/16 0554 08/11/16 0536  NA 140 139 137 136 137  K 3.8 3.4* 3.5 3.4* 3.3*  CL 109 107 105 105 106  CO2 _0 GLUCOSE 114* 75 101* 115* 93  BUN _1 <5*  CREATININE 0.88  0.79 0.89 0.80 0.71  CALCIUM 9.2 8.4* 8.1* 8.1* 7.9*   GFR: Estimated Creatinine Clearance: 69.4 mL/min (by C-G formula based on SCr of 0.71 mg/dL). Liver Function Tests:  Recent Labs Lab 08/08/16 1038 08/08/16 1722 08/09/16 0621 08/10/16 0554 08/11/16 0536  AST _2 ALT _3 ALKPHOS 45 36* 37* 33* 33*  BILITOT 0.8 0.6 0.8 0.7 0.5  PROT 7.6 6.7 6.4* 6.4* 6.0*  ALBUMIN 4.1 3.7 3.5 3.4* 3.1*   No results for input(s): LIPASE, AMYLASE in the last 168 hours. No results for input(s): AMMONIA in the last 168 hours. Coagulation Profile:  Recent Labs Lab 08/08/16 1108 08/09/16 0621 08/11/16 0536  INR 0.98 1.06 0.99   Cardiac Enzymes: No results for input(s): CKTOTAL, CKMB, CKMBINDEX, TROPONINI in the last 168 hours. BNP (last 3 results) No results for input(s): PROBNP in the last 8760 hours. HbA1C: No results for input(s): HGBA1C in the last 72 hours. CBG: No results for input(s): GLUCAP in the last 168 hours. Lipid Profile: No results for input(s): CHOL, HDL, LDLCALC, TRIG, CHOLHDL, LDLDIRECT in the last 72 hours. Thyroid Function Tests:  Recent Labs  08/10/16 1403  TSH 0.875   Anemia Panel: No results for input(s): VITAMINB12, FOLATE, FERRITIN, TIBC, IRON, RETICCTPCT in the last 72 hours. Urine analysis:    Component Value Date/Time   COLORURINE YELLOW 08/08/2016 1008   APPEARANCEUR CLEAR 08/08/2016 1008   LABSPEC 1.009 08/08/2016 1008   PHURINE 6.0 08/08/2016 1008   GLUCOSEU NEGATIVE 08/08/2016 1008   HGBUR NEGATIVE 08/08/2016 1008   BILIRUBINUR NEGATIVE 08/08/2016 1008   KETONESUR NEGATIVE 08/08/2016 1008   PROTEINUR NEGATIVE 08/08/2016 1008   NITRITE NEGATIVE 08/08/2016 1008   LEUKOCYTESUR NEGATIVE 08/08/2016 1008   Sepsis Labs: _4 (procalcitonin:4,lacticidven:4)  ) Recent Results (from the past 240 hour(s))  Blood culture (routine x 2)     Status: None (Preliminary result)   Collection Time: 08/08/16 11:08 AM  Result  Value Ref Range Status   Specimen Description BLOOD RIGHT ANTECUBITAL  Final   Special Requests BOTTLES DRAWN AEROBIC AND ANAEROBIC 8CC  Final   Culture   Final    NO GROWTH 4 DAYS Performed at [facility], [place] [address], [place], [place] [address]    Report Status PENDING  Incomplete  Blood culture (routine x 2)     Status: None (Preliminary result)   Collection Time: 08/08/16 11:32 AM  Result Value Ref Range Status   Specimen Description BLOOD LEFT ANTECUBITAL  Final   Special Requests BOTTLES DRAWN AEROBIC AND ANAEROBIC 10CC  Final   Culture   Final    NO GROWTH 4 DAYS Performed at [facility], [address], [place], [place] [address]    Report Status PENDING  Incomplete  Culture, blood (routine x 2)     Status: None (Preliminary result)   Collection Time: 08/08/16  5:22 PM  Result Value Ref Range Status   Specimen Description BLOOD LEFT ANTECUBITAL  Final   Special Requests BOTTLES DRAWN AEROBIC AND ANAEROBIC 6CC  Final   Culture   Final    NO GROWTH 4 DAYS Performed at [facility], [place] [address], [place], [place] [address]    Report Status PENDING  Incomplete  Culture, blood (routine x 2)     Status: None (Preliminary result)   Collection Time: 08/09/16  6:27 AM  Result Value Ref Range Status   Specimen Description BLOOD LEFT ANTECUBITAL  Final   Special Requests BOTTLES DRAWN AEROBIC AND ANAEROBIC 5CC  Final   Culture   Final    NO GROWTH 3 DAYS Performed at [facility], [place] [address], [place], [place] [address]    Report Status PENDING  Incomplete  Anaerobic culture     Status: None (Preliminary result)   Collection Time: 08/09/16  3:15 PM  Result Value Ref Range Status   Specimen Description CSF  Final   Special Requests NONE  Final   Gram Stain   Final    NO WBC SEEN NO ORGANISMS SEEN CYTOSPIN SMEAR Performed at [facility], [place] [address] [place], [place] [address]    Culture   Final    NO ANAEROBES ISOLATED; CULTURE  IN PROGRESS FOR 5 DAYS   Report Status PENDING  Incomplete  CSF culture     Status: None (Preliminary result)   Collection Time: 08/09/16  3:15 PM  Result Value Ref Range Status   Specimen Description CSF  Final   Special Requests NONE  Final   Gram Stain NO WBC SEEN NO ORGANISMS SEEN CYTOSPIN SMEAR   Final   Culture   Final    NO GROWTH 3 DAYS Performed at [facility], [place] [address] [place], [place] [address]    Report Status PENDING  Incomplete  Culture, fungus without smear     Status: None (Preliminary result)   Collection Time: 08/09/16  3:15 PM  Result Value Ref Range Status   Specimen Description CSF  Final   Special Requests NONE  Final   Culture   Final    NO FUNGUS ISOLATED AFTER 3 DAYS Performed at [facility], [place] [address] [place], [place] [address]    Report Status PENDING  Incomplete         Radiology Studies: Mr Brain Wo Contrast  Result Date: 08/11/2016 CLINICAL DATA:  Headache EXAM: MRI HEAD WITHOUT CONTRAST MRV HEAD WITHOUT CONTRAST TECHNIQUE: Multiplanar, multiecho pulse sequences of the brain and surrounding structures were obtained without intravenous contrast. Angiographic images of the intracranial venous structures were obtained using MRV technique without intravenous contrast. COMPARISON:  MRI head 01/19/2012 FINDINGS: Ventricle size and cerebral volume normal. Negative for acute or chronic infarction. Negative for hemorrhage, mass, or edema in the brain. Normal arterial flow voids. No skull lesion.  Paranasal sinuses clear.  Normal orbit. Superior sagittal sinus is widely patent. Transverse sinus patent bilaterally. Right transverse sinus is dominant. Right sigmoid sinus and jugular vein patent. Smaller left jugular vein which appears patent and has normal flow void on the axial T2 weighted images of the brain. IMPRESSION: Normal MRI of the brain Normal MRV of the  brain. Electronically Signed   By: Franchot Gallo M.D.   On: 08/11/2016  12:56   Mr Annell [place] Head  Result Date: 08/11/2016 CLINICAL DATA:  Headache EXAM: MRI HEAD WITHOUT CONTRAST MRV HEAD WITHOUT CONTRAST TECHNIQUE: Multiplanar, multiecho pulse sequences of the brain and surrounding structures were obtained without intravenous contrast. Angiographic images of the intracranial venous structures were obtained using MRV technique without intravenous contrast. COMPARISON:  MRI head 01/19/2012 FINDINGS: Ventricle size and cerebral volume normal. Negative for acute or chronic infarction. Negative for hemorrhage, mass, or edema in the brain. Normal arterial flow voids. No skull lesion.  Paranasal sinuses clear.  Normal orbit. Superior sagittal sinus is widely patent. Transverse sinus patent bilaterally. Right transverse sinus is dominant. Right sigmoid sinus and jugular vein patent. Smaller left jugular vein which appears patent and has normal flow void on the axial T2 weighted images of the brain. IMPRESSION: Normal MRI of the brain Normal MRV of the brain. Electronically Signed   By: Franchot Gallo M.D.   On: 08/11/2016 12:56        Scheduled Meds: . levothyroxine  125 mcg Oral QAC breakfast  . metoCLOPramide  5 mg Oral TID AC  . pantoprazole  40 mg Oral Daily  . prochlorperazine  10 mg Intravenous Once  . sodium fluoride   dental QHS   Continuous Infusions:    LOS: 4 days    Time spent: [place], MD [facility] ([facility]   If 7PM-7AM, please contact night-coverage [email or web address] Password [facility] 08/12/2016, 3:53 PM

## 2016-08-12 NOTE — Progress Notes (Signed)
Patient ID: Kristen Dougherty, female   DOB: Dec 21, 1960, 56 y.o.   MRN: [id number]          [facility] for Infectious Disease    Date of Admission:  08/08/2016     I spoke with Dr. Mahala Menghini this afternoon. [name] is afebrile and apparently feeling better. Her CSF studies were normal. HSV PCR was negative, cryptococcal antigen was negative and cultures were negative. Her CMV and EBV serologies are confusing as they often are as she has both positive IgM and IgG antibodies. I doubt that she has active CMV or EBV infection. I am in agreement with stopping acyclovir and fluconazole and discharging her home ceftriaxone. We will sign off now.  Cliffton Asters, MD [facility] for Infectious Disease [facility] [phone] pager   [phone] cell 08/12/2016, 4:02 PM

## 2016-08-13 LAB — CULTURE, BLOOD (ROUTINE X 2)
CULTURE: NO GROWTH
CULTURE: NO GROWTH
Culture: NO GROWTH

## 2016-08-13 LAB — B. BURGDORFI ANTIBODIES

## 2016-08-13 LAB — COMPREHENSIVE METABOLIC PANEL
ALBUMIN: 3.5 g/dL (ref 3.5–5.0)
ALT: 35 U/L (ref 14–54)
ANION GAP: 7 (ref 5–15)
AST: 34 U/L (ref 15–41)
Alkaline Phosphatase: 50 U/L (ref 38–126)
BILIRUBIN TOTAL: 0.3 mg/dL (ref 0.3–1.2)
BUN: 9 mg/dL (ref 6–20)
CHLORIDE: 107 mmol/L (ref 101–111)
CO2: 26 mmol/L (ref 22–32)
Calcium: 8.6 mg/dL — ABNORMAL LOW (ref 8.9–10.3)
Creatinine, Ser: 0.76 mg/dL (ref 0.44–1.00)
GFR calc Af Amer: >60 mL/min (ref >60)
GFR calc non Af Amer: >60 mL/min (ref >60)
GLUCOSE: 103 mg/dL — AB (ref 65–99)
POTASSIUM: 3.5 mmol/L (ref 3.5–5.1)
SODIUM: 140 mmol/L (ref 135–145)
TOTAL PROTEIN: 6.4 g/dL — AB (ref 6.5–8.1)

## 2016-08-13 LAB — CBC WITH DIFFERENTIAL/PLATELET
Basophils Absolute: 0.1 10*3/uL (ref 0.0–0.1)
Basophils Relative: 1 %
EOS ABS: 0.3 10*3/uL (ref 0.0–0.7)
Eosinophils Relative: 5 %
HEMATOCRIT: 29.6 % — AB (ref 36.0–46.0)
Hemoglobin: 10 g/dL — ABNORMAL LOW (ref 12.0–15.0)
LYMPHS ABS: 1.5 10*3/uL (ref 0.7–4.0)
LYMPHS PCT: 26 %
MCH: 28.8 pg (ref 26.0–34.0)
MCHC: 33.8 g/dL (ref 30.0–36.0)
MCV: 85.3 fL (ref 78.0–100.0)
MONOS PCT: 11 %
Monocytes Absolute: 0.6 10*3/uL (ref 0.1–1.0)
NEUTROS ABS: 3.1 10*3/uL (ref 1.7–7.7)
NEUTROS PCT: 57 %
PLATELETS: 189 10*3/uL (ref 150–400)
RBC: 3.47 MIL/uL — AB (ref 3.87–5.11)
RDW: 12.9 % (ref 11.5–15.5)
WBC: 5.5 10*3/uL (ref 4.0–10.5)

## 2016-08-13 LAB — FUNGAL ANTIBODIES PANEL, ID-BLOOD
ASPERGILLUS FLAVUS: NEGATIVE
ASPERGILLUS FUMIGATUS IGG: NEGATIVE
Aspergillus niger: NEGATIVE
Blastomyces Abs, Qn, DID: NEGATIVE
HISTOPLASMA AB ID: NEGATIVE

## 2016-08-13 LAB — ROCKY MTN SPOTTED FVR ABS PNL(IGG+IGM)
RMSF IGG: NEGATIVE
RMSF IgM: 0.56 index (ref 0.00–0.89)

## 2016-08-13 LAB — EHRLICHIA ANTIBODY PANEL
E CHAFFEENSIS AB, IGM: NEGATIVE
E chaffeensis (HGE) Ab, IgG: NEGATIVE
E. Chaffeensis (HME) IgM Titer: NEGATIVE
E.Chaffeensis (HME) IgG: NEGATIVE

## 2016-08-13 LAB — MPO/PR-3 (ANCA) ANTIBODIES
ANCA PROTEINASE 3: 3.7 U/mL — AB (ref 0.0–3.5)
Myeloperoxidase Abs: <9 U/mL (ref 0.0–9.0)

## 2016-08-13 LAB — CSF CULTURE W GRAM STAIN: Culture: NO GROWTH

## 2016-08-13 LAB — CSF CULTURE: GRAM STAIN: NONE SEEN

## 2016-08-13 MED ORDER — ONDANSETRON 4 MG PO TBDP: 4.0000 mg | ORAL_TABLET | Freq: Three times a day (TID) | ORAL | 0 refills | Status: AC | PRN

## 2016-08-13 MED ORDER — ISOMETHEPTENE-DICHLORAL-APAP 65-100-325 MG PO CAPS: 2.0000 | ORAL_CAPSULE | ORAL | 0 refills | Status: AC | PRN

## 2016-08-13 MED ORDER — OXYCODONE HCL 5 MG PO TABS: 5.0000 mg | ORAL_TABLET | ORAL | 0 refills | Status: AC | PRN

## 2016-08-13 NOTE — Discharge Summary (Signed)
Physician Discharge Summary  Kristen Dougherty ZOX:[id number] DOB: 06/16/1960 DOA: 08/08/2016  PCP: Jilda Panda, MD  Admit date: 08/08/2016 Discharge date: 08/13/2016  Time spent: 40 minutes  Recommendations for Outpatient Follow-up:  1. Needs follow-up of [place] fever , Ehrlichia, and borrelia ab as op 2. Referred to Dr. Charlestine Night for Rheum follow up when possible-patient has had a r/o of rheumatological processes with lab work as below this admit 3. Needs OP follow up for L arm pain with her neurologist or neurosurgeon for consideration of repeat surgery 4. Should defer hysterectomy in late April if symptoms are not resolved 5. 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  LP to R/o meningitis non specific with no white cells- received in ED at [facility] vanc/cefepime-started on broad abx coverage initally-Seen by ID Dr. Hatcher-d/c Vanc and Cefepime 3/30--given that CMV and EBV titers are equivocal discontinued acyclovir and fluconazole 4/1 after discussion with Dr. Megan Salon . Follow tickborne illness serology as an OP-Still pending are RMSF, Borrellia,  Ehrlichia  blood cult 8.11 is neg   work-up to include AI processes--ESR neg/ CRP neg/ RA neg/HSV 1 and 2 negative/ HIV neg/Hepatitis panel negative.  D/w Neuro--appreciate managent Headaches-no need for further Neuro work-up.  D/c droplet precautions.                Headache-continue Midrin,started oxycodone 5 every 4 when necessary--can use tylenol for HA             Neuropathy-ordered Clonazepam 1 gm tid--  She has been declining to use clonazapam in hopsital  Rash-self-resolved             GERD-cont Protonix 40 daily             Nausea-adding phenergan  Consultations:  ID  Neurology  Discharge Exam: Vitals:   08/13/16 0100 08/13/16 0601  BP:  127/70  Pulse:  75  Resp:  18  Temp: 98.2 F (36.8 C) 98 F (36.7 C)    General: alert oriented and in nad Cardiovascular: s1 s 2no m/r/g Respiratory: clear no added sound Rash decreased-swelling decreased  Discharge Instructions    Current Discharge Medication List    START taking these medications   Details  isometheptene-acetaminophen-dichloralphenazone (MIDRIN) 65-100-325 MG capsule Take 2 capsules by mouth every 4 (four) hours as needed for migraine or headache. Maximum 5 capsules in 12 hours for migraine headaches, 8 capsules in 24 hours for tension headaches. Qty: 30 capsule, Refills: 0    ondansetron (ZOFRAN ODT) 4 MG disintegrating tablet Take 1 tablet (4 mg total) by mouth every 8 (eight) hours as needed for nausea  or vomiting. Qty: 20 tablet, Refills: 0    oxyCODONE (OXY IR/ROXICODONE) 5 MG immediate release tablet Take 1 tablet (5 mg total) by mouth every 4 (four) hours as needed for severe pain. Qty: 30 tablet, Refills: 0      CONTINUE these medications which have NOT CHANGED   Details  acetaminophen (TYLENOL) 500 MG tablet Take 1,000 mg by mouth every 4 (four) hours as needed for mild pain, moderate pain, fever or headache.    Biotin 5000 MCG TABS Take 10,000 mcg by mouth at bedtime.    clonazePAM  (KLONOPIN) 1 MG tablet Take 1 mg by mouth at bedtime. For muscle spasms and sleep Refills: 0    cyanocobalamin (,VITAMIN B-12,) 1000 MCG/ML injection INJECT 1 MILLILITER EVERY MONTH Refills: 0    levothyroxine (SYNTHROID, LEVOTHROID) 125 MCG tablet Take 125 mcg by mouth daily before breakfast.    pantoprazole (PROTONIX) 40 MG tablet Take 40 mg by mouth 2 (two) times daily.    zolpidem (AMBIEN) 10 MG tablet Take 10 mg by mouth at bedtime.    ranitidine (ZANTAC) 150 MG tablet Take 1 tablet (150 mg total) by mouth 2 (two) times daily. Qty: 60 tablet, Refills: 5      STOP taking these medications     calcium-vitamin D (OSCAL WITH D) 500-200 MG-UNIT tablet      sodium fluoride (SF) 1.1 % GEL dental gel      zolpidem (AMBIEN CR) 12.5 MG CR tablet        Allergies  Allergen Reactions  . Morphine Nausea And Vomiting  . Azithromycin     REACTION: stomach upset  . Hydrocodone Other (See Comments)    migraine   . Vancomycin     Red Man Syndrome       The results of significant diagnostics from this hospitalization (including imaging, microbiology, ancillary and laboratory) are listed below for reference.    Significant Diagnostic Studies: Dg Chest 2 View  Result Date: 08/08/2016 CLINICAL DATA:  Fever and shortness of breath EXAM: CHEST  2 VIEW COMPARISON:  August 09, 2010. FINDINGS: There is no edema or consolidation. The heart size and pulmonary vascularity are normal. No adenopathy. No bone lesions. IMPRESSION: No edema or consolidation. Electronically Signed   By: Lowella Grip III M.D.   On: 08/08/2016 10:27   Mr Brain Wo Contrast  Result Date: 08/11/2016 CLINICAL DATA:  Headache EXAM: MRI HEAD WITHOUT CONTRAST MRV HEAD WITHOUT CONTRAST TECHNIQUE: Multiplanar, multiecho pulse sequences of the brain and surrounding structures were obtained without intravenous contrast. Angiographic images of the intracranial venous structures were obtained using MRV technique without  intravenous contrast. COMPARISON:  MRI head 01/19/2012 FINDINGS: Ventricle size and cerebral volume normal. Negative for acute or chronic infarction. Negative for hemorrhage, mass, or edema in the brain. Normal arterial flow voids. No skull lesion.  Paranasal sinuses clear.  Normal orbit. Superior sagittal sinus is widely patent. Transverse sinus patent bilaterally. Right transverse sinus is dominant. Right sigmoid sinus and jugular vein patent. Smaller left jugular vein which appears patent and has normal flow void on the axial T2 weighted images of the brain. IMPRESSION: Normal MRI of the brain Normal MRV of the brain. Electronically Signed   By: Franchot Gallo M.D.   On: 08/11/2016 12:56   Mr Annell Greening Head  Result Date: 08/11/2016 CLINICAL DATA:  Headache EXAM: MRI HEAD WITHOUT CONTRAST MRV HEAD WITHOUT CONTRAST TECHNIQUE: Multiplanar, multiecho pulse sequences of the brain and surrounding structures were obtained without intravenous contrast.  Angiographic images of the intracranial venous structures were obtained using MRV technique without intravenous contrast. COMPARISON:  MRI head 01/19/2012 FINDINGS: Ventricle size and cerebral volume normal. Negative for acute or chronic infarction. Negative for hemorrhage, mass, or edema in the brain. Normal arterial flow voids. No skull lesion.  Paranasal sinuses clear.  Normal orbit. Superior sagittal sinus is widely patent. Transverse sinus patent bilaterally. Right transverse sinus is dominant. Right sigmoid sinus and jugular vein patent. Smaller left jugular vein which appears patent and has normal flow void on the axial T2 weighted images of the brain. IMPRESSION: Normal MRI of the brain Normal MRV of the brain. Electronically Signed   By: Franchot Gallo M.D.   On: 08/11/2016 12:56   Mr Neck Soft Tissue Only W Wo Contrast  Result Date: 08/09/2016 CLINICAL DATA:  56 year old female with neck stiffness and severe neck pain. Initial encounter. EXAM: MRI OF THE  NECK WITH CONTRAST TECHNIQUE: Multiplanar, multisequence MR imaging was performed following the administration of intravenous contrast. COMPARISON:  None. FINDINGS: Pharynx and larynx: Negative larynx and pharynx. Negative parapharyngeal and retropharyngeal spaces. Salivary glands: Negative sublingual space, submandibular glands, sublingual glands, and parotid glands. Thyroid: Negative. Lymph nodes: No cervical lymphadenopathy. No focal inflammatory soft tissue changes identified on either side of the neck. Vascular: Major vascular flow voids in the neck are preserved. Limited intracranial: Negative. Visualized orbits: Not included. Mastoids and visualized paranasal sinuses: Clear. Skeleton: Visualized bone marrow signal is within normal limits. No marrow edema or evidence of acute osseous abnormality. No cervical spinal stenosis or significant disc degeneration. Mild left C4 neural foraminal stenosis related to uncovertebral hypertrophy. Visible spinal cord signal appears normal. No abnormal intradural enhancement. Upper chest: Trace layering pleural effusions, otherwise negative. No superior mediastinal lymphadenopathy. IMPRESSION: 1. No acute or inflammatory process identified. Normal MRI appearance of the neck soft tissues. 2. Minimal to mild for age cervical spine degeneration. No cervical spinal stenosis. Mild left C4 neural foraminal stenosis. Electronically Signed   By: Genevie Ann M.D.   On: 08/09/2016 09:30   Dg Fluoro Guide Lumbar Puncture  Result Date: 08/09/2016 CLINICAL DATA:  Meningitis EXAM: DIAGNOSTIC LUMBAR PUNCTURE UNDER FLUOROSCOPIC GUIDANCE FLUOROSCOPY TIME:  Fluoroscopy Time:  0 minutes 23 seconds Radiation Exposure Index (if provided by the fluoroscopic device): Number of Acquired Spot Images: 0 PROCEDURE: Informed consent was obtained from the patient prior to the procedure, including potential complications of headache, allergy, and pain. With the patient prone, the lower back was prepped  with Betadine. 1% Lidocaine was used for local anesthesia. Lumbar puncture was performed at the L4-5 level using a 20 gauge needle with return of clear CSF with an opening pressure of 16 cm water. Fourteen ml of CSF were obtained for laboratory studies. The patient tolerated the procedure well and there were no apparent complications. IMPRESSION: Successful lumbar puncture using fluoroscopic guidance. Electronically Signed   By: Franchot Gallo M.D.   On: 08/09/2016 16:06    Microbiology: Recent Results (from the past 240 hour(s))  Blood culture (routine x 2)     Status: None (Preliminary result)   Collection Time: 08/08/16 11:08 AM  Result Value Ref Range Status   Specimen Description BLOOD RIGHT ANTECUBITAL  Final   Special Requests BOTTLES DRAWN AEROBIC AND ANAEROBIC 8CC  Final   Culture   Final    NO GROWTH 4 DAYS Performed at [facility], [place] [address] [place], [place] [address]    Report Status PENDING  Incomplete  Blood  culture (routine x 2)     Status: None (Preliminary result)   Collection Time: 08/08/16 11:32 AM  Result Value Ref Range Status   Specimen Description BLOOD LEFT ANTECUBITAL  Final   Special Requests BOTTLES DRAWN AEROBIC AND ANAEROBIC 10CC  Final   Culture   Final    NO GROWTH 4 DAYS Performed at [facility], [place] [address], [place], [place] [address]    Report Status PENDING  Incomplete  Culture, blood (routine x 2)     Status: None (Preliminary result)   Collection Time: 08/08/16  5:22 PM  Result Value Ref Range Status   Specimen Description BLOOD LEFT ANTECUBITAL  Final   Special Requests BOTTLES DRAWN AEROBIC AND ANAEROBIC 6CC  Final   Culture   Final    NO GROWTH 4 DAYS Performed at [facility], [place] [address], [place], [place] [address]    Report Status PENDING  Incomplete  Culture, blood (routine x 2)     Status: None (Preliminary result)   Collection Time: 08/09/16  6:27 AM  Result Value Ref Range Status   Specimen  Description BLOOD LEFT ANTECUBITAL  Final   Special Requests BOTTLES DRAWN AEROBIC AND ANAEROBIC 5CC  Final   Culture   Final    NO GROWTH 3 DAYS Performed at [facility], [place] [address], [place], [place] [address]    Report Status PENDING  Incomplete  Anaerobic culture     Status: None (Preliminary result)   Collection Time: 08/09/16  3:15 PM  Result Value Ref Range Status   Specimen Description CSF  Final   Special Requests NONE  Final   Gram Stain   Final    NO WBC SEEN NO ORGANISMS SEEN CYTOSPIN SMEAR Performed at [facility], [place] [address] [place], [place] [address]    Culture   Final    NO ANAEROBES ISOLATED; CULTURE IN PROGRESS FOR 5 DAYS   Report Status PENDING  Incomplete  CSF culture     Status: None (Preliminary result)   Collection Time: 08/09/16  3:15 PM  Result Value Ref Range Status   Specimen Description CSF  Final   Special Requests NONE  Final   Gram Stain NO WBC SEEN NO ORGANISMS SEEN CYTOSPIN SMEAR   Final   Culture   Final    NO GROWTH 3 DAYS Performed at [facility], [place] [address] [place], [place] [address]    Report Status PENDING  Incomplete  Culture, fungus without smear     Status: None (Preliminary result)   Collection Time: 08/09/16  3:15 PM  Result Value Ref Range Status   Specimen Description CSF  Final   Special Requests NONE  Final   Culture   Final    NO FUNGUS ISOLATED AFTER 3 DAYS Performed at [facility], [place] [address], [place], [place] [address]    Report Status PENDING  Incomplete     Labs: Basic Metabolic Panel:  Recent Labs Lab 08/08/16 1722 08/09/16 0621 08/10/16 0554 08/11/16 0536 08/13/16 0445  NA 139 137 136 137 140  K 3.4* 3.5 3.4* 3.3* 3.5  CL 107 105 105 106 107  CO2 25 27 25 25 26   GLUCOSE 75 101* 115* 93 103*  BUN 9 7 6  <5* 9  CREATININE 0.79 0.89 0.80 0.71 0.76  CALCIUM 8.4* 8.1* 8.1* 7.9* 8.6*   Liver Function Tests:  Recent Labs Lab 08/08/16 1722 08/09/16 2683  08/10/16 0554 08/11/16 0536 08/13/16 0445  AST 16 17 18 21  34  ALT 19 17 16 18  35  ALKPHOS 36* 37* 33* 33* 50  BILITOT 0.6 0.8 0.7 0.5 0.3  PROT 6.7 6.4* 6.4* 6.0* 6.4*  ALBUMIN 3.7 3.5 3.4* 3.1* 3.5   No results for input(s): LIPASE, AMYLASE in the last 168 hours. No results for input(s): AMMONIA in the last 168 hours. CBC:  Recent Labs Lab 08/08/16 1038 08/08/16 1722 08/09/16 0621 08/10/16 0554 08/11/16 0536 08/13/16 0445  WBC 6.2 6.3 6.8 5.7 5.3 5.5  NEUTROABS 4.4 4.3  --  4.3 3.2 3.1  HGB 13.0 11.7* 11.0* 10.2* 10.0* 10.0*  HCT 37.2 33.9* 31.8* 29.8* 28.5* 29.6*  MCV 86.7 87.4 87.4 83.9 86.9 85.3  PLT 186 164 181 165 158 189   Cardiac Enzymes: No results for input(s): CKTOTAL, CKMB, CKMBINDEX, TROPONINI in the last 168 hours. BNP: BNP (last 3 results) No results for input(s): BNP in the last 8760 hours.  ProBNP (last 3 results) No results for input(s): PROBNP in the last 8760 hours.  CBG: No results for input(s): GLUCAP in the last 168 hours.     SignedNita Sells MD   [facility] 08/13/2016, 10:45 AM

## 2016-08-14 LAB — CULTURE, BLOOD (ROUTINE X 2): Culture: NO GROWTH

## 2016-08-14 LAB — ANCA TITERS

## 2016-08-14 LAB — ANAEROBIC CULTURE: GRAM STAIN: NONE SEEN

## 2016-08-30 LAB — CULTURE, FUNGUS WITHOUT SMEAR

## 2017-03-27 ENCOUNTER — Other Ambulatory Visit: Payer: Self-pay | Admitting: Neurosurgery

## 2017-03-27 DIAGNOSIS — M5412 Radiculopathy, cervical region: Secondary | ICD-10-CM

## 2017-04-14 ENCOUNTER — Ambulatory Visit
Admission: RE | Admit: 2017-04-14 | Discharge: 2017-04-14 | Disposition: A | Discharge status: Home / Self Care | Payer: 59 | Source: Ambulatory Visit | Attending: Neurosurgery | Admitting: Neurosurgery

## 2017-04-14 DIAGNOSIS — M5412 Radiculopathy, cervical region: Secondary | ICD-10-CM

## 2017-12-20 ENCOUNTER — Other Ambulatory Visit: Payer: Self-pay

## 2017-12-20 ENCOUNTER — Emergency Department (HOSPITAL_BASED_OUTPATIENT_CLINIC_OR_DEPARTMENT_OTHER)
Admission: EM | Admit: 2017-12-20 | Discharge: 2017-12-20 | Disposition: A | Discharge status: Home / Self Care | Payer: 59 | Attending: Emergency Medicine | Admitting: Emergency Medicine

## 2017-12-20 ENCOUNTER — Encounter (HOSPITAL_BASED_OUTPATIENT_CLINIC_OR_DEPARTMENT_OTHER): Payer: Self-pay | Admitting: *Deleted

## 2017-12-20 DIAGNOSIS — Z5321 Procedure and treatment not carried out due to patient leaving prior to being seen by health care provider: Secondary | ICD-10-CM | POA: Insufficient documentation

## 2017-12-20 DIAGNOSIS — R1110 Vomiting, unspecified: Secondary | ICD-10-CM | POA: Diagnosis not present

## 2017-12-20 NOTE — ED Notes (Signed)
Registration saw pt leave with her husband. Husband stated he was taking her somewhere that she could be seen faster. Pt was told on arrival there was a 2 hour wait.

## 2017-12-20 NOTE — ED Triage Notes (Signed)
Fever since yesterday. Vomiting and diarrhea. States the last time she felt like this she had salmonella.

## 2017-12-23 ENCOUNTER — Other Ambulatory Visit: Payer: Self-pay | Admitting: Gastroenterology

## 2017-12-23 DIAGNOSIS — R112 Nausea with vomiting, unspecified: Secondary | ICD-10-CM

## 2017-12-23 NOTE — ED Notes (Signed)
Follow up call made  Pt went to another health system  After LWBS   No better  Has appointment with her md  Today,  Will return here if needed  12/23/17  0828  s Kali Deadwyler rn

## 2018-01-03 ENCOUNTER — Ambulatory Visit
Admission: RE | Admit: 2018-01-03 | Discharge: 2018-01-03 | Disposition: A | Discharge status: Home / Self Care | Payer: 59 | Source: Ambulatory Visit | Attending: Gastroenterology | Admitting: Gastroenterology

## 2018-01-03 DIAGNOSIS — R112 Nausea with vomiting, unspecified: Secondary | ICD-10-CM

## 2018-05-17 IMAGING — CR DG CHEST 2V
2 series · 2 of 2 positions shown · non-contrast
Comparison: August 09, 2010.

CLINICAL DATA: Fever and shortness of breath

EXAM:
CHEST  2 VIEW

[w chest pa]
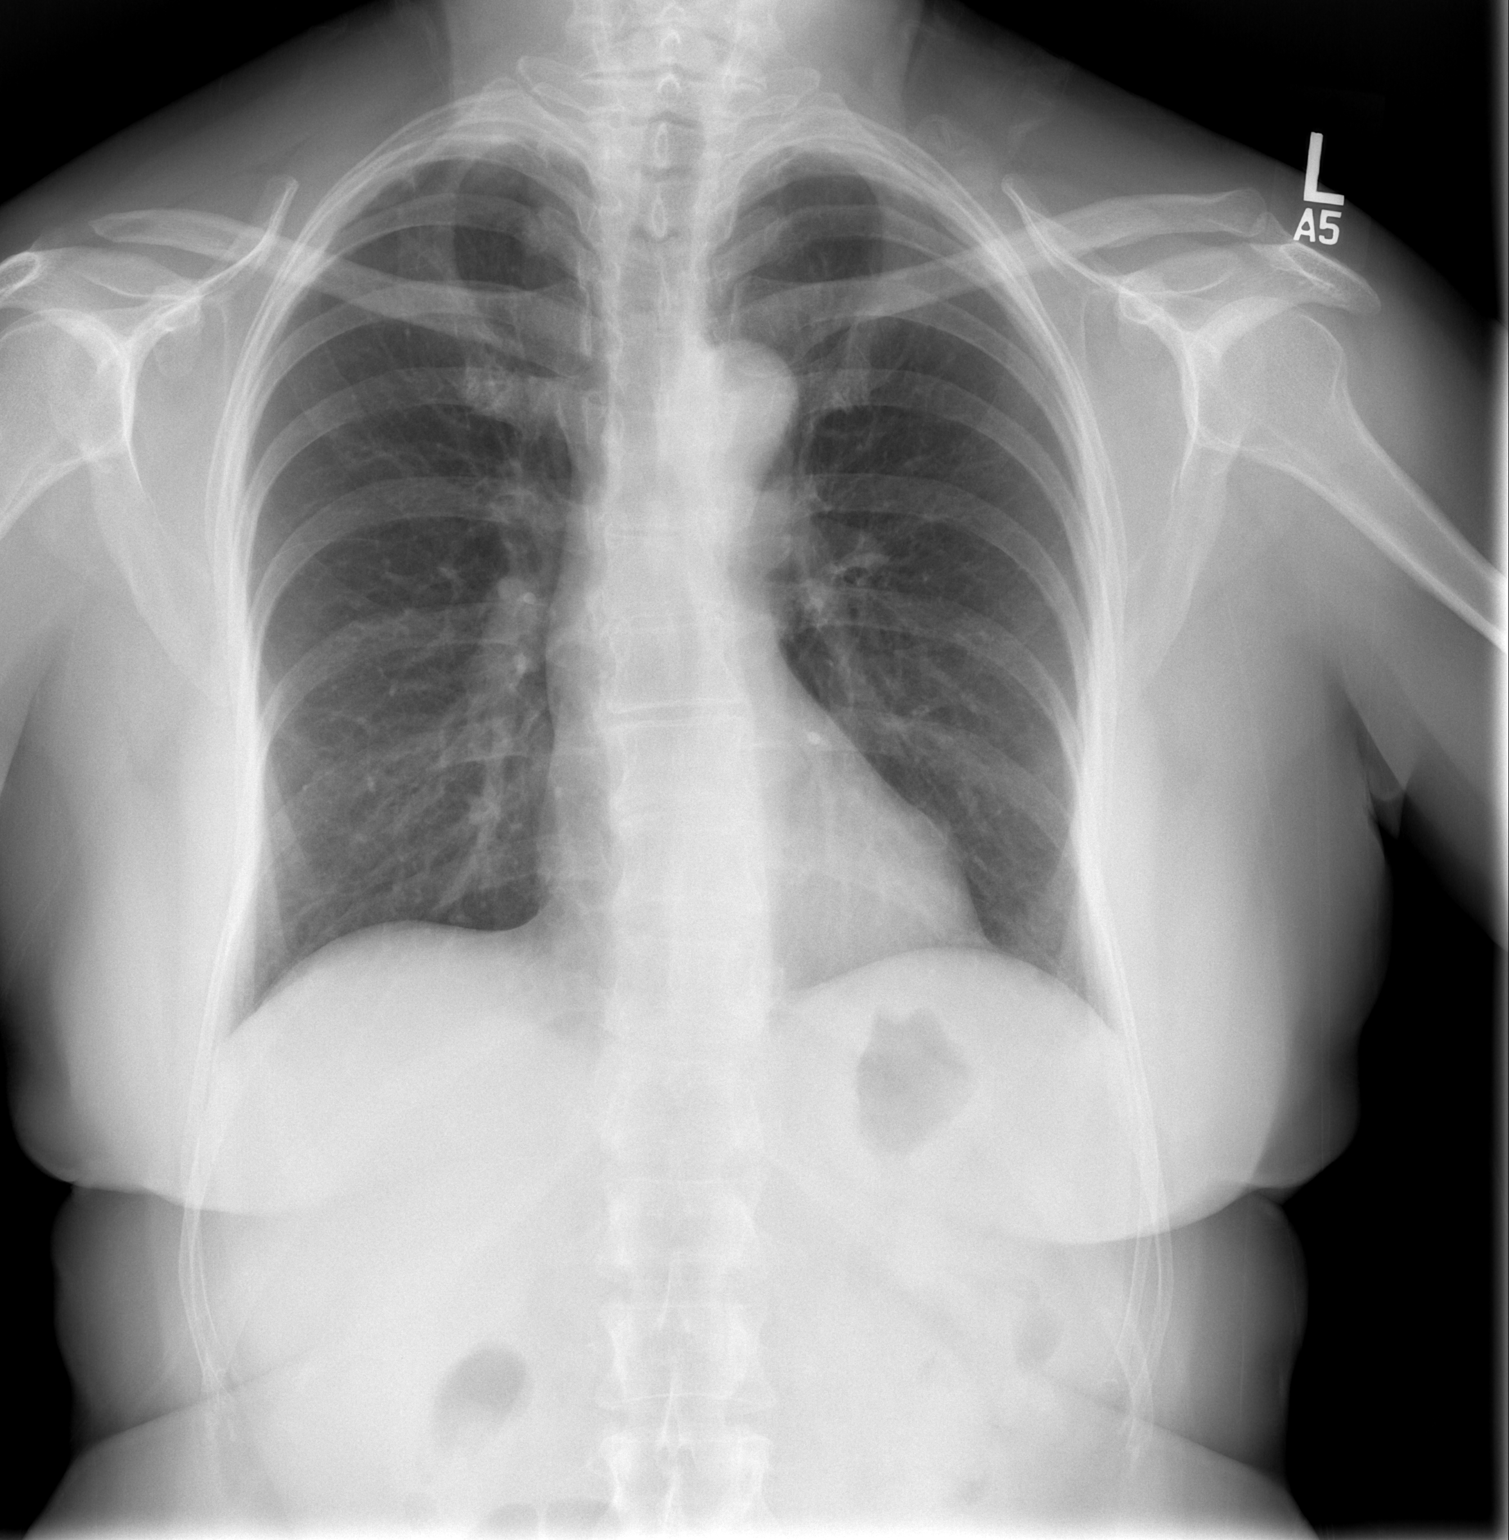

[w chest lat]
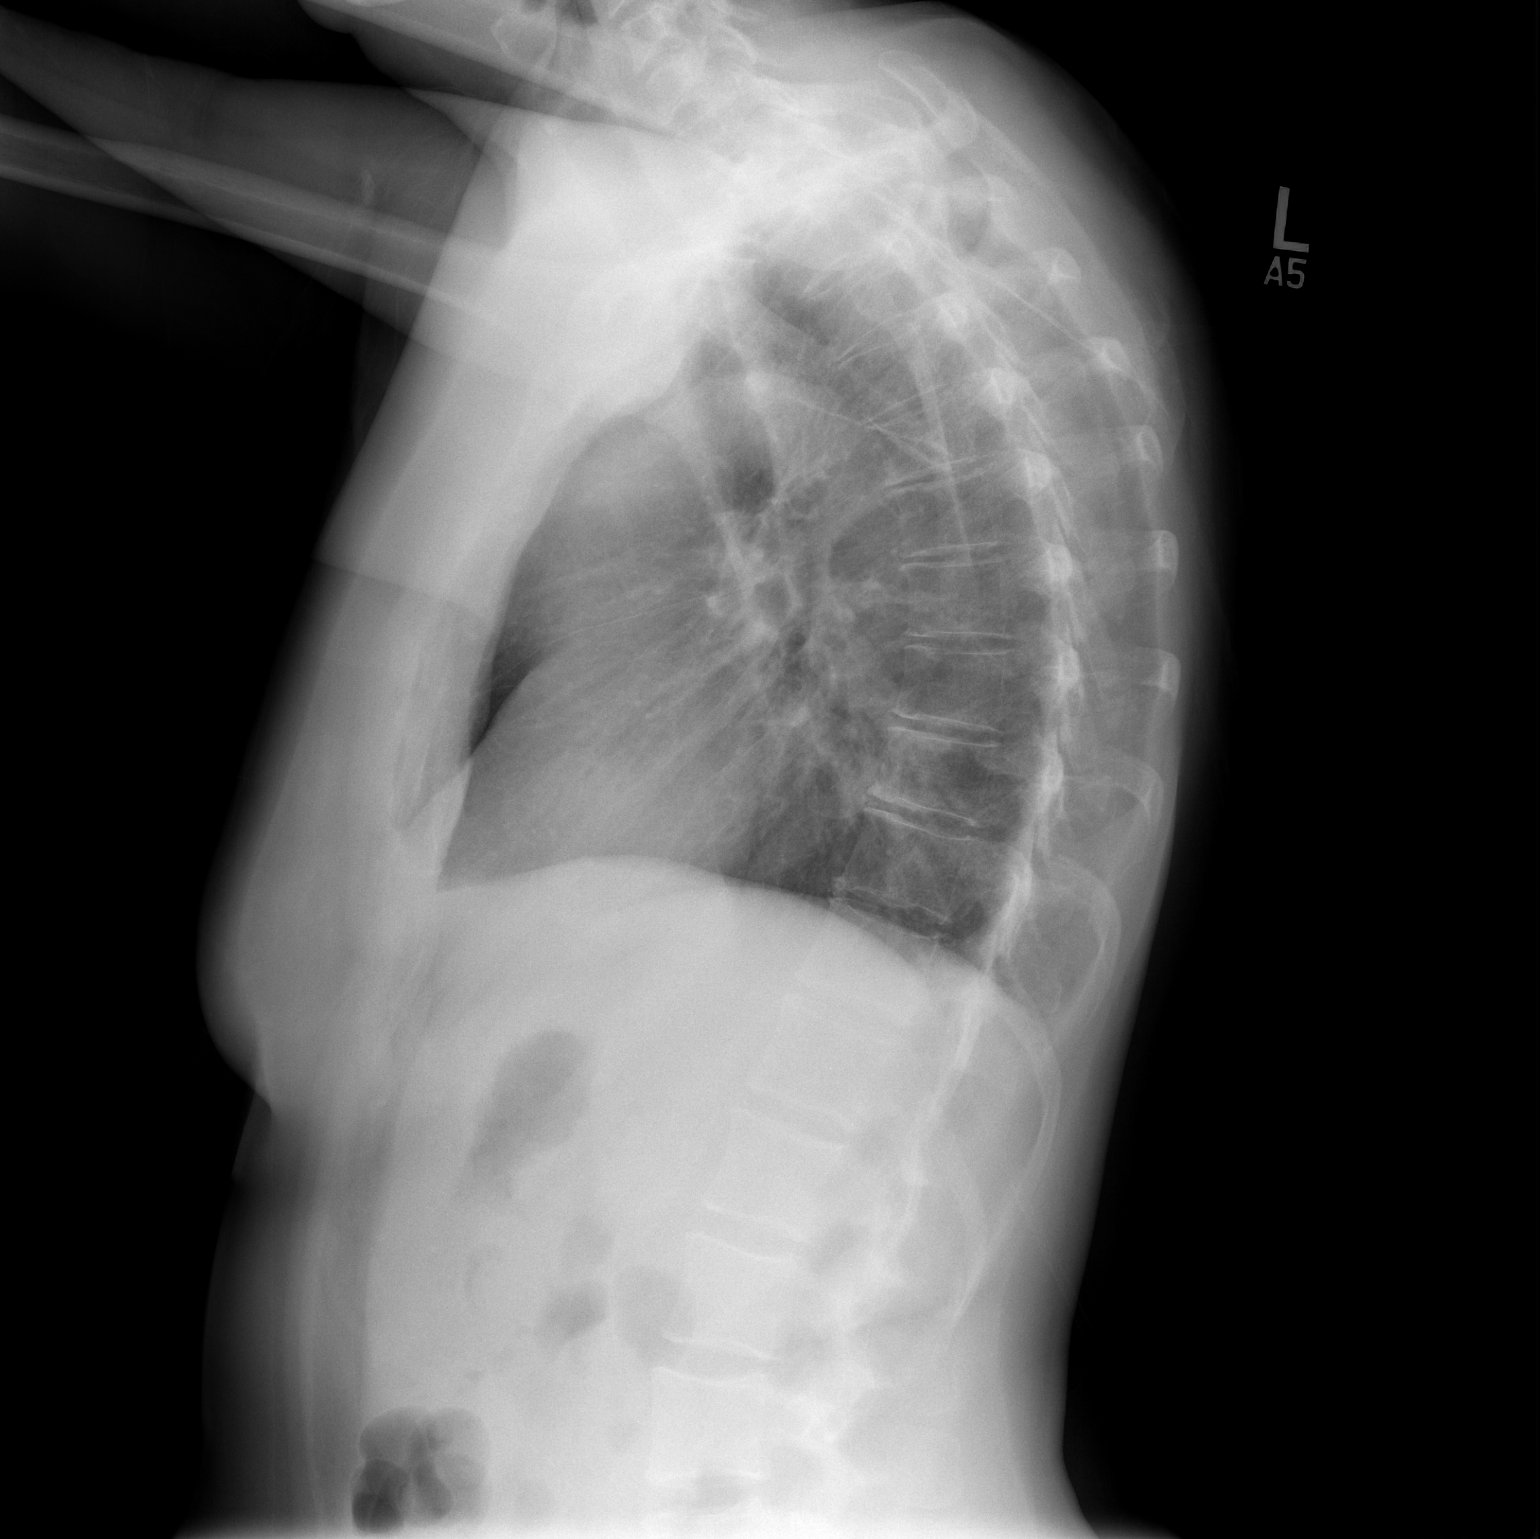

[2 of 2 positions shown; findings below may reference images not displayed]

FINDINGS: There is no edema or consolidation. The heart size and pulmonary
vascularity are normal. No adenopathy. No bone lesions.
IMPRESSION: No edema or consolidation.

## 2018-05-18 IMAGING — MR MR NECK SOFT TISSUE ONLY WO/W CM
5 of 11 series · 19 of 48 positions shown · IV contrast (agent unspecified)
Comparison: None.

CLINICAL DATA: 56-year-old female with neck stiffness and severe
neck pain. Initial encounter.

EXAM:
MRI OF THE NECK WITH CONTRAST
TECHNIQUE: Multiplanar, multisequence MR imaging was performed following the
administration of intravenous contrast.

[Series 3: T1 · coronal · 5.0mm · 0.47mm/px · 1 of 28 slices shown]
[im 1/28]
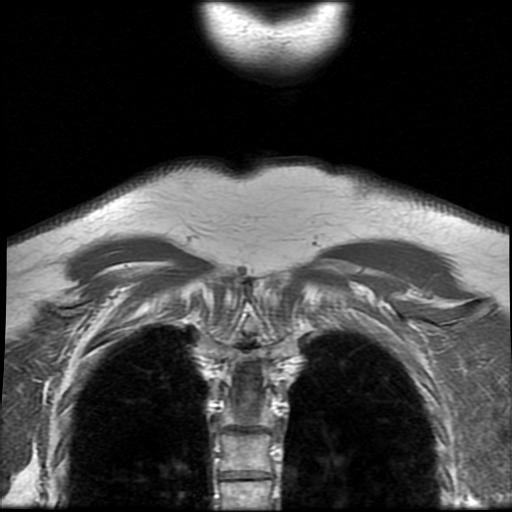

[Series 7: T2 fat-sat · coronal · 5.0mm · 0.47mm/px · 4 of 28 slices shown (1 of 2)]
[im 1/28]
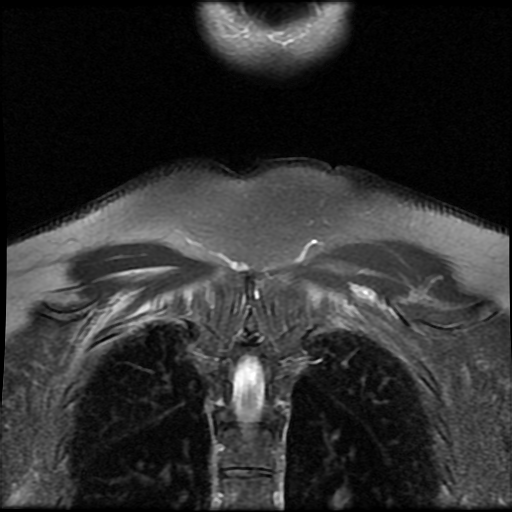
[im 10/28]
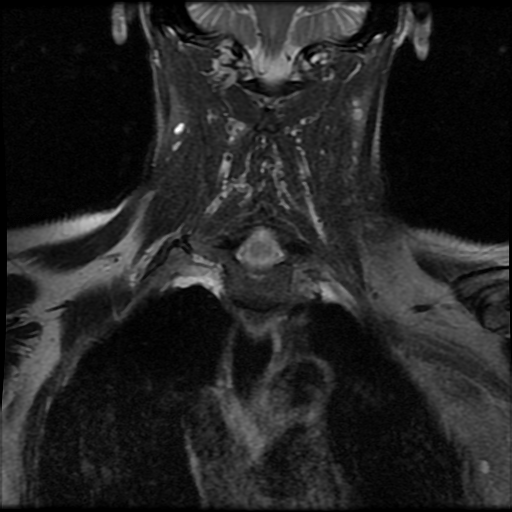
[im 19/28]
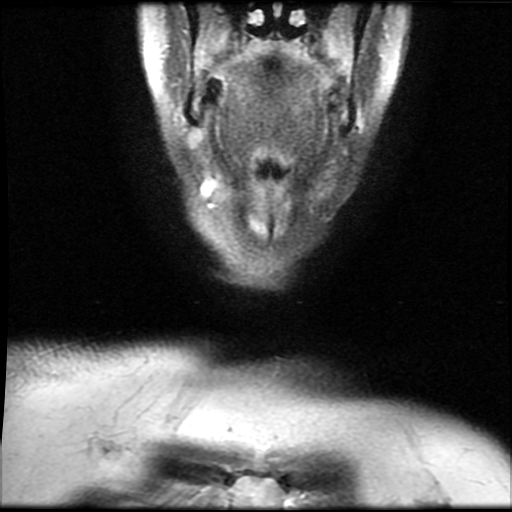
[im 28/28]
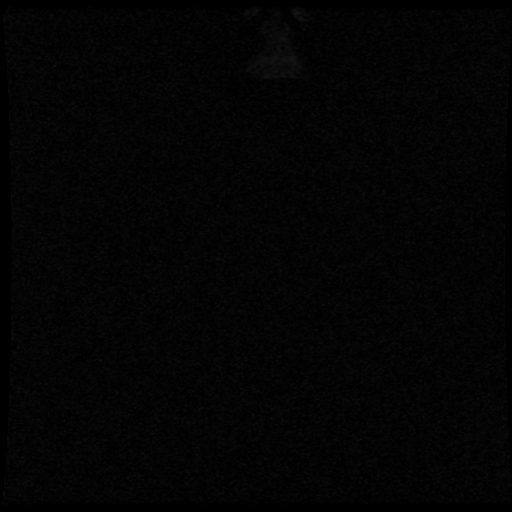

[Series 8: T2 fat-sat · sagittal · 5.0mm · 0.47mm/px · 4 of 30 slices shown (2 of 2)]
[im 1/30]
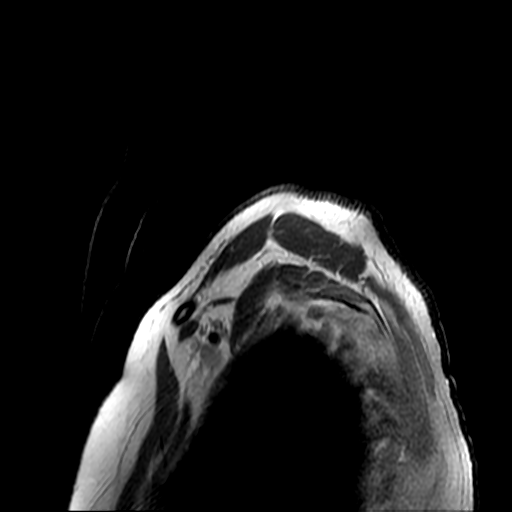
[im 10/30]
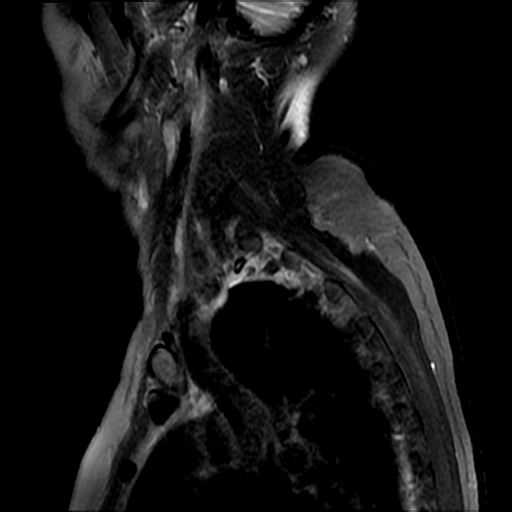
[im 20/30]
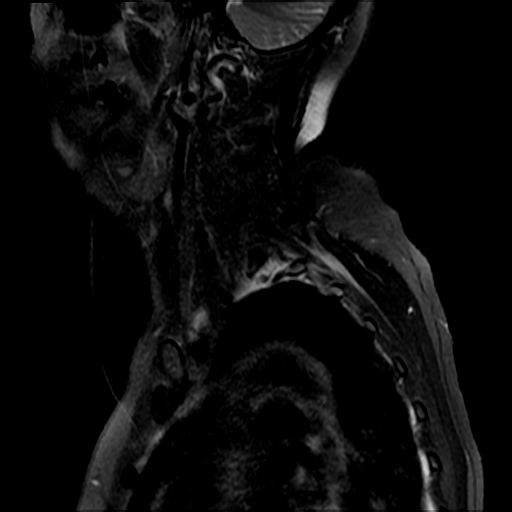
[im 30/30]
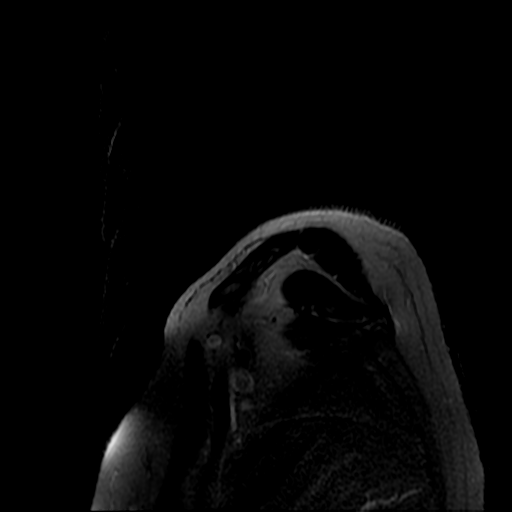

[Series 9: T2 · axial · 5.0mm · 0.47mm/px · z∈[-69,+129]mm · 5 of 34 slices shown]
[im 1/34]
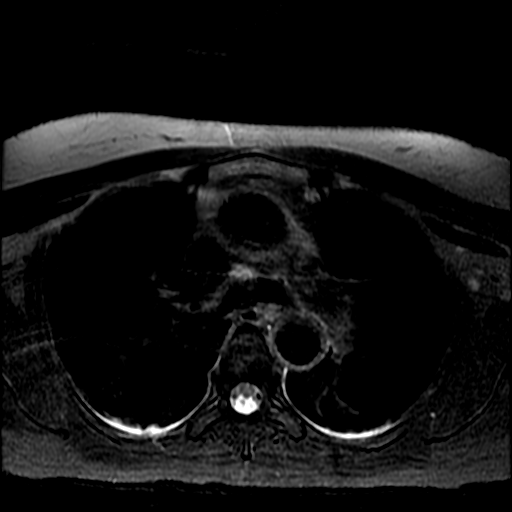
[im 9/34]
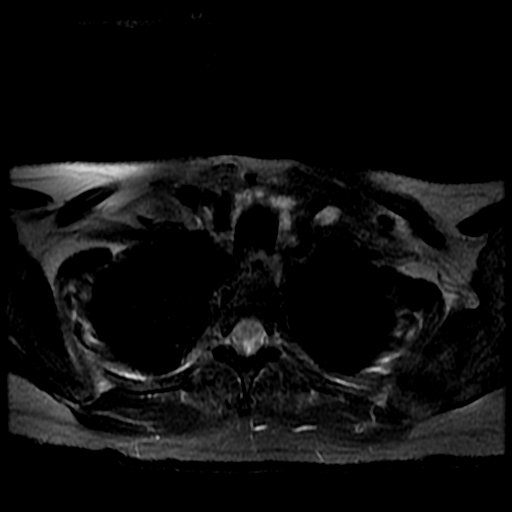
[im 17/34]
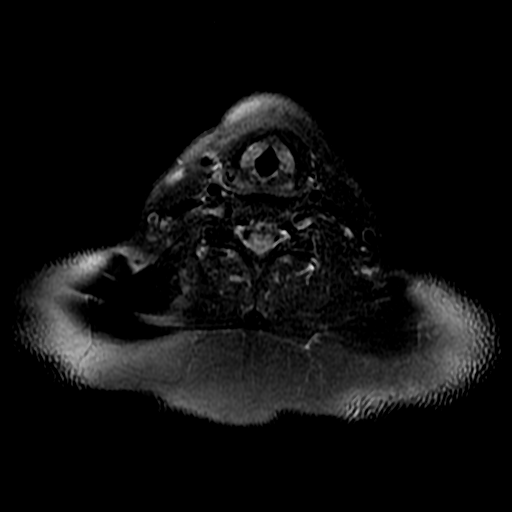
[im 25/34]
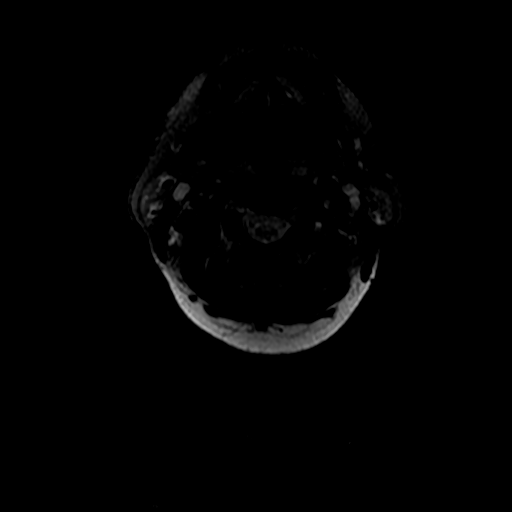
[im 34/34]
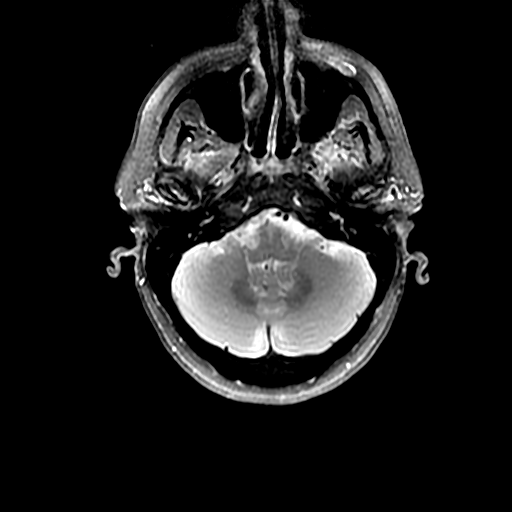

[Series 13: T1 post-contrast · axial · 5.0mm · 0.47mm/px · z∈[-69,+129]mm · 5 of 34 slices shown]
[im 1/34]
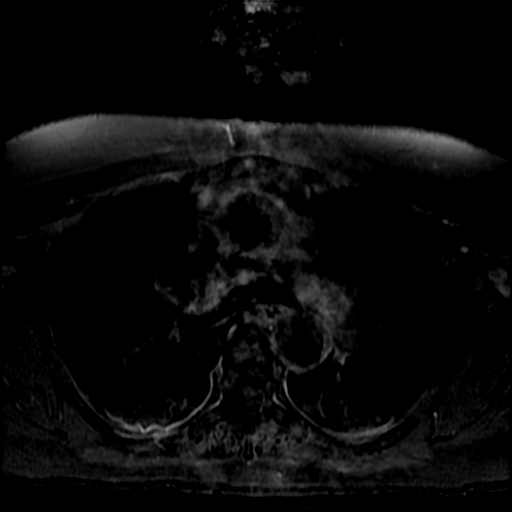
[im 9/34]
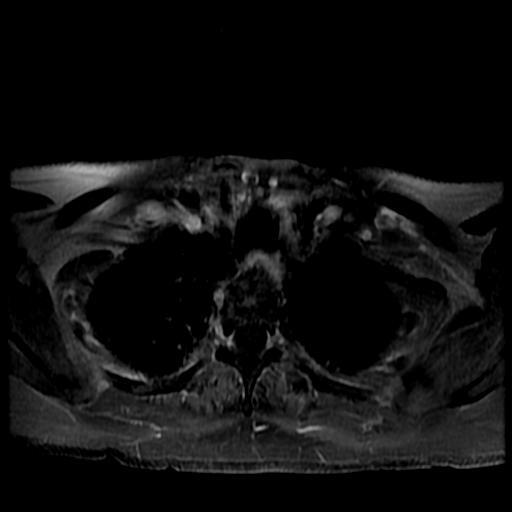
[im 17/34]
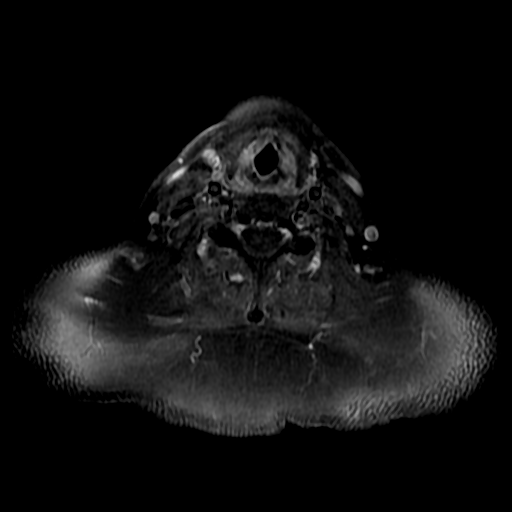
[im 25/34]
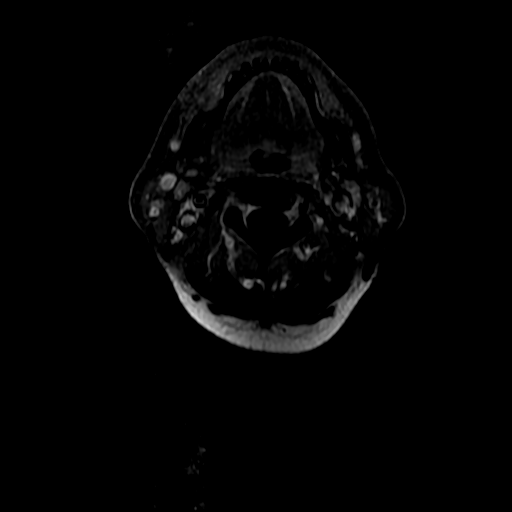
[im 34/34]
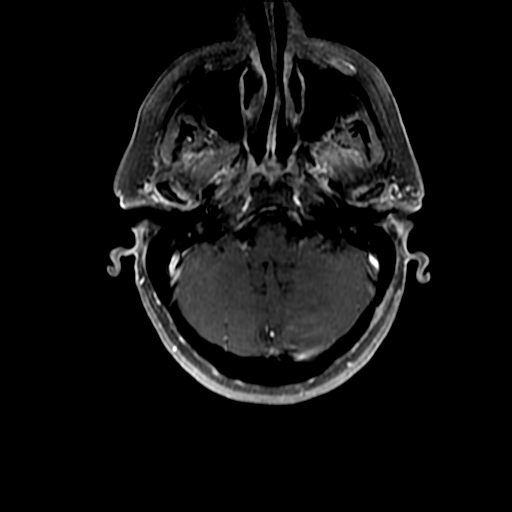

[19 of 48 positions shown; findings below may reference images not displayed]

FINDINGS: Pharynx and larynx: Negative larynx and pharynx. Negative
parapharyngeal and retropharyngeal spaces.

Salivary glands: Negative sublingual space, submandibular glands,
sublingual glands, and parotid glands.

Thyroid: Negative.

Lymph nodes: No cervical lymphadenopathy.

No focal inflammatory soft tissue changes identified on either side
of the neck.

Vascular: Major vascular flow voids in the neck are preserved.

Limited intracranial: Negative.

Visualized orbits: Not included.

Mastoids and visualized paranasal sinuses: Clear.

Skeleton: Visualized bone marrow signal is within normal limits. No
marrow edema or evidence of acute osseous abnormality.

No cervical spinal stenosis or significant disc degeneration. Mild
left C4 neural foraminal stenosis related to uncovertebral
hypertrophy.

Visible spinal cord signal appears normal. No abnormal intradural
enhancement.

Upper chest: Trace layering pleural effusions, otherwise negative.
No superior mediastinal lymphadenopathy.
IMPRESSION: 1. No acute or inflammatory process identified. Normal MRI
appearance of the neck soft tissues.
2. Minimal to mild for age cervical spine degeneration. No cervical
spinal stenosis. Mild left C4 neural foraminal stenosis.

## 2020-03-01 ENCOUNTER — Other Ambulatory Visit: Payer: Self-pay | Admitting: Gastroenterology

## 2020-03-01 DIAGNOSIS — R1110 Vomiting, unspecified: Secondary | ICD-10-CM

## 2021-10-27 ENCOUNTER — Ambulatory Visit
Admission: RE | Admit: 2021-10-27 | Discharge: 2021-10-27 | Disposition: A | Discharge status: Home / Self Care | Payer: 59 | Source: Ambulatory Visit | Attending: Internal Medicine | Admitting: Internal Medicine

## 2021-10-27 ENCOUNTER — Other Ambulatory Visit: Payer: Self-pay | Admitting: Internal Medicine

## 2021-10-27 DIAGNOSIS — R52 Pain, unspecified: Secondary | ICD-10-CM

## 2021-12-25 ENCOUNTER — Other Ambulatory Visit: Payer: Self-pay | Admitting: Internal Medicine

## 2021-12-25 DIAGNOSIS — M8580 Other specified disorders of bone density and structure, unspecified site: Secondary | ICD-10-CM

## 2021-12-25 DIAGNOSIS — Z1231 Encounter for screening mammogram for malignant neoplasm of breast: Secondary | ICD-10-CM

## 2023-02-06 ENCOUNTER — Other Ambulatory Visit (HOSPITAL_COMMUNITY): Payer: Self-pay | Admitting: Gastroenterology

## 2023-02-06 DIAGNOSIS — R198 Other specified symptoms and signs involving the digestive system and abdomen: Secondary | ICD-10-CM

## 2023-02-12 ENCOUNTER — Encounter (HOSPITAL_COMMUNITY)
Admission: RE | Admit: 2023-02-12 | Discharge: 2023-02-12 | Disposition: A | Discharge status: Home / Self Care | Payer: 59 | Source: Ambulatory Visit | Attending: Gastroenterology | Admitting: Gastroenterology

## 2023-02-12 DIAGNOSIS — R198 Other specified symptoms and signs involving the digestive system and abdomen: Secondary | ICD-10-CM | POA: Insufficient documentation

## 2023-02-12 MED ORDER — TECHNETIUM TC 99M SULFUR COLLOID
1.8800 | Freq: Once | INTRAVENOUS | Status: AC | PRN
  Administered 2023-02-12: 1.88 via INTRAVENOUS

## 2023-02-22 ENCOUNTER — Other Ambulatory Visit (HOSPITAL_COMMUNITY): Payer: Self-pay | Admitting: Gastroenterology

## 2023-02-22 DIAGNOSIS — R1011 Right upper quadrant pain: Secondary | ICD-10-CM

## 2023-02-28 ENCOUNTER — Encounter (HOSPITAL_COMMUNITY)
Admission: RE | Admit: 2023-02-28 | Discharge: 2023-02-28 | Disposition: A | Discharge status: Home / Self Care | Payer: 59 | Source: Ambulatory Visit | Attending: Gastroenterology | Admitting: Gastroenterology

## 2023-02-28 DIAGNOSIS — R1011 Right upper quadrant pain: Secondary | ICD-10-CM | POA: Insufficient documentation

## 2023-02-28 MED ORDER — TECHNETIUM TC 99M MEBROFENIN IV KIT
5.4000 | PACK | Freq: Once | INTRAVENOUS | Status: AC | PRN
  Administered 2023-02-28: 5.4 via INTRAVENOUS

## 2023-12-10 ENCOUNTER — Other Ambulatory Visit: Payer: Self-pay | Admitting: Internal Medicine

## 2023-12-10 DIAGNOSIS — R10819 Abdominal tenderness, unspecified site: Secondary | ICD-10-CM

## 2023-12-10 DIAGNOSIS — R109 Unspecified abdominal pain: Secondary | ICD-10-CM

## 2023-12-12 ENCOUNTER — Ambulatory Visit
Admission: RE | Admit: 2023-12-12 | Discharge: 2023-12-12 | Disposition: A | Discharge status: Home / Self Care | Source: Ambulatory Visit | Attending: Internal Medicine | Admitting: Internal Medicine

## 2023-12-12 DIAGNOSIS — R10819 Abdominal tenderness, unspecified site: Secondary | ICD-10-CM

## 2023-12-12 DIAGNOSIS — R109 Unspecified abdominal pain: Secondary | ICD-10-CM

## 2023-12-12 MED ORDER — IOPAMIDOL (ISOVUE-370) INJECTION 76%
80.0000 mL | Freq: Once | INTRAVENOUS | Status: AC | PRN
  Administered 2023-12-12: 80 mL via INTRAVENOUS
